# Patient Record
Sex: Male | Born: 1990
Health system: Southern US, Community
[De-identification: ages and names within clinical notes are randomized; demographics above are authoritative.]

## PROBLEM LIST (undated history)

## (undated) DIAGNOSIS — L409 Psoriasis, unspecified: Secondary | ICD-10-CM

## (undated) DIAGNOSIS — J45909 Unspecified asthma, uncomplicated: Secondary | ICD-10-CM

## (undated) DIAGNOSIS — I10 Essential (primary) hypertension: Secondary | ICD-10-CM

## (undated) DIAGNOSIS — J3089 Other allergic rhinitis: Secondary | ICD-10-CM

## (undated) DIAGNOSIS — E782 Mixed hyperlipidemia: Secondary | ICD-10-CM

## (undated) DIAGNOSIS — D721 Eosinophilia, unspecified: Secondary | ICD-10-CM

## (undated) DIAGNOSIS — R002 Palpitations: Secondary | ICD-10-CM

## (undated) DIAGNOSIS — G4733 Obstructive sleep apnea (adult) (pediatric): Secondary | ICD-10-CM

## (undated) HISTORY — DX: Obstructive sleep apnea (adult) (pediatric): G47.33

## (undated) HISTORY — DX: Mixed hyperlipidemia: E78.2

## (undated) HISTORY — DX: Unspecified asthma, uncomplicated: J45.909

## (undated) HISTORY — DX: Eosinophilia, unspecified: D72.10

## (undated) HISTORY — DX: Palpitations: R00.2

## (undated) HISTORY — DX: Essential (primary) hypertension: I10

## (undated) HISTORY — DX: Other allergic rhinitis: J30.89

## (undated) HISTORY — DX: Psoriasis, unspecified: L40.9

## (undated) HISTORY — PX: WISDOM TOOTH EXTRACTION: SHX21

---
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
## 2008-03-11 ENCOUNTER — Ambulatory Visit: Payer: Self-pay | Admitting: Internal Medicine

## 2008-03-11 DIAGNOSIS — J3089 Other allergic rhinitis: Secondary | ICD-10-CM

## 2008-03-11 DIAGNOSIS — J45909 Unspecified asthma, uncomplicated: Secondary | ICD-10-CM

## 2008-11-30 ENCOUNTER — Encounter: Payer: Self-pay | Admitting: Internal Medicine

## 2010-02-13 NOTE — Letter (Signed)
Summary: [facility]  [facility] [place] [place]   Imported By: Maryln Gottron 02/21/2009 15:28:19  _____________________________________________________________________  External Attachment:    Type:   Image     Comment:   External Document  Appended Document: [facility] continuing meds except the decongestant

## 2015-11-02 ENCOUNTER — Ambulatory Visit (INDEPENDENT_AMBULATORY_CARE_PROVIDER_SITE_OTHER): Payer: BLUE CROSS/BLUE SHIELD | Admitting: Family Medicine

## 2015-11-02 ENCOUNTER — Encounter: Payer: Self-pay | Admitting: Family Medicine

## 2015-11-02 VITALS — BP 140/98 | HR 72 | Temp 98.3°F | Ht 68.0 in | Wt 303.5 lb

## 2015-11-02 DIAGNOSIS — R0683 Snoring: Secondary | ICD-10-CM

## 2015-11-02 DIAGNOSIS — Z0000 Encounter for general adult medical examination without abnormal findings: Secondary | ICD-10-CM | POA: Diagnosis not present

## 2015-11-02 DIAGNOSIS — J3089 Other allergic rhinitis: Secondary | ICD-10-CM

## 2015-11-02 DIAGNOSIS — G4733 Obstructive sleep apnea (adult) (pediatric): Secondary | ICD-10-CM | POA: Insufficient documentation

## 2015-11-02 DIAGNOSIS — I10 Essential (primary) hypertension: Secondary | ICD-10-CM | POA: Insufficient documentation

## 2015-11-02 DIAGNOSIS — J4520 Mild intermittent asthma, uncomplicated: Secondary | ICD-10-CM | POA: Diagnosis not present

## 2015-11-02 DIAGNOSIS — Z23 Encounter for immunization: Secondary | ICD-10-CM | POA: Diagnosis not present

## 2015-11-02 DIAGNOSIS — Z9989 Dependence on other enabling machines and devices: Secondary | ICD-10-CM

## 2015-11-02 DIAGNOSIS — R030 Elevated blood-pressure reading, without diagnosis of hypertension: Secondary | ICD-10-CM

## 2015-11-02 DIAGNOSIS — Z6841 Body Mass Index (BMI) 40.0 and over, adult: Secondary | ICD-10-CM

## 2015-11-02 DIAGNOSIS — L409 Psoriasis, unspecified: Secondary | ICD-10-CM | POA: Insufficient documentation

## 2015-11-02 NOTE — Assessment & Plan Note (Signed)
Largely resolved, declines inhaler refill.

## 2015-11-02 NOTE — Assessment & Plan Note (Signed)
Controlled on current regimen.   

## 2015-11-02 NOTE — Addendum Note (Signed)
Addended by: Josph MachoANCE, KIMBERLY A on: 11/02/2015 04:09 PM   Modules accepted: Orders

## 2015-11-02 NOTE — Assessment & Plan Note (Signed)
Preventative protocols reviewed and updated unless pt declined. Discussed healthy diet and lifestyle.  

## 2015-11-02 NOTE — Assessment & Plan Note (Addendum)
Discussed healthy diet and lifestyle changes to affect sustainable weight loss. Pt motivated for weight loss - has joined [employer], bought elliptical.

## 2015-11-02 NOTE — Progress Notes (Signed)
BP (!) 140/98   Pulse 72   Temp 98.3 F (36.8 C) (Oral)   Ht 5\' 8"  (1.727 m)   Wt (!) 303 lb 8 oz (137.7 kg)   BMI 46.15 kg/m    CC: new pt to establish care Subjective:    Patient ID: Micheal [name], male    DOB: 03-Dec-1990, 25 y.o.   MRN: [id number]  HPI: Micheal [name] is a 25 y.o. male presenting on 11/02/2015 for Establish Care (? Sleep apnea; +snoring; recently started weight watchers)   No dx HTN.   Scalp psoriasis - pending derm appt   ?OSA - snoring present. Possible apnea. No PNdyspnea. Endorses restful sleep. Very mild daytime sleepiness.   Obesity - just started weight watchers. Just bought elliptical   Childhood asthma - no recent problems. Perennial allergic rhinitis.   Preventative: No recent CPE  Declines flu shot  Tdap today Seat belt use discussed Sunscreen use discussed. No changing moles on skin Non smoker Alcohol - none   Lives with parents Edu: BS Occ: HR rep at [employer]  Activity: active at church [facility] [employer] [employer]. No regular exercise. Just bought elliptical  Diet: joined weight watchers  Relevant past medical, surgical, family and social history reviewed and updated as indicated. Interim medical history since our last visit reviewed. Allergies and medications reviewed and updated. No current outpatient prescriptions on file prior to visit.   No current facility-administered medications on file prior to visit.     Review of Systems  Constitutional: Negative for activity change, appetite change, chills, fatigue, fever and unexpected weight change.  HENT: Negative for hearing loss.   Eyes: Negative for visual disturbance.  Respiratory: Negative for cough, chest tightness, shortness of breath and wheezing.   Cardiovascular: Negative for chest pain, palpitations and leg swelling.  Gastrointestinal: Negative for abdominal distention, abdominal pain, blood in stool, constipation, diarrhea, nausea and vomiting.  Genitourinary: Negative  for difficulty urinating and hematuria.  Musculoskeletal: Negative for arthralgias, myalgias and neck pain.  Skin: Negative for rash.  Neurological: Negative for dizziness, seizures, syncope and headaches.  Hematological: Negative for adenopathy. Does not bruise/bleed easily.  Psychiatric/Behavioral: Negative for dysphoric mood. The patient is not nervous/anxious.    Per HPI unless specifically indicated in ROS section     Objective:    BP (!) 140/98   Pulse 72   Temp 98.3 F (36.8 C) (Oral)   Ht 5\' 8"  (1.727 m)   Wt (!) 303 lb 8 oz (137.7 kg)   BMI 46.15 kg/m   Wt Readings from Last 3 Encounters:  11/02/15 (!) 303 lb 8 oz (137.7 kg)  03/11/08 (!) 268 lb (121.6 kg) (>99 %, Z > 2.33)*   * Growth percentiles are based on CDC 2-20 Years data.    Physical Exam  Constitutional: He is oriented to person, place, and time. He appears well-developed and well-nourished. No distress.  HENT:  Head: Normocephalic and atraumatic.  Right Ear: Hearing, tympanic membrane, external ear and ear canal normal.  Left Ear: Hearing, tympanic membrane, external ear and ear canal normal.  Nose: Nose normal.  Mouth/Throat: Uvula is midline, oropharynx is clear and moist and mucous membranes are normal. No oropharyngeal exudate, posterior oropharyngeal edema or posterior oropharyngeal erythema.  Eyes: Conjunctivae and EOM are normal. Pupils are equal, round, and reactive to light. No scleral icterus.  Neck: Normal range of motion. Neck supple. No thyromegaly present.  Cardiovascular: Normal rate, regular rhythm, normal heart sounds and intact distal  pulses.   No murmur heard. Pulses:      Radial pulses are 2+ on the right side, and 2+ on the left side.  Pulmonary/Chest: Effort normal and breath sounds normal. No respiratory distress. He has no wheezes. He has no rales.  Abdominal: Soft. Bowel sounds are normal. He exhibits no distension and no mass. There is no tenderness. There is no rebound and no  guarding.  Musculoskeletal: Normal range of motion. He exhibits no edema.  Lymphadenopathy:    He has no cervical adenopathy.  Neurological: He is alert and oriented to person, place, and time.  CN grossly intact, station and gait intact  Skin: Skin is warm and dry. Rash noted.  Erythematous rash on scalp, nasal edges, and extensor surfaces  Psychiatric: He has a normal mood and affect. His behavior is normal. Judgment and thought content normal.  Nursing note and vitals reviewed.  No results found for this or any previous visit.    Assessment & Plan:   Problem List Items Addressed This Visit    Asthma    Largely resolved, declines inhaler refill.      Elevated blood pressure reading    bp elevated today - pt will monitor at home and return sooner if persistently elevated. See pt instructions for plan.      Health maintenance examination - Primary    Preventative protocols reviewed and updated unless pt declined. Discussed healthy diet and lifestyle.       Morbid obesity with BMI of 45.0-49.9, adult (HCC)    Discussed healthy diet and lifestyle changes to affect sustainable weight loss. Pt motivated for weight loss - has joined [employer], bought elliptical.      Perennial allergic rhinitis    Controlled on current regimen.      Psoriasis    Scalp and some inverse. Controlled with tea tree shampoo. Pending derm eval next month.      Snoring    ESS today = 7. Obesity increases risk. Pt requests referral for sleep eval.      Relevant Orders   Ambulatory referral to Pulmonology    Other Visit Diagnoses   None.      Follow up plan: Return in about 1 year (around 11/01/2016) for annual exam, prior fasting for blood work.  Eustaquio Boyden, MD

## 2015-11-02 NOTE — Patient Instructions (Addendum)
Tdap today Return at your convenience for fasting labs Sleepiness scale today.  blodo pressure was a bit elevated today. Goal <140/90. Start monitoring at home a few times a week. If staying consistently above goal, return to see me.  Work on low salt/sodium diet - goal <1.5gm (1,500mg ) per day. Eat a diet high in fruits/vegetables and whole grains.  Look into mediterranean and DASH diet. Goal activity is 15min/wk of moderate intensity exercise.  This can be split into 30 minute chunks.  If you are not at this level, you can start with smaller 10-15 min increments and slowly build up activity. Look at www.heart.org for more resources   Health Maintenance, Male A healthy lifestyle and preventative care can promote health and wellness.  Maintain regular health, dental, and eye exams.  Eat a healthy diet. Foods like vegetables, fruits, whole grains, low-fat dairy products, and lean protein foods contain the nutrients you need and are low in calories. Decrease your intake of foods high in solid fats, added sugars, and salt. Get information about a proper diet from your health care provider, if necessary.  Regular physical exercise is one of the most important things you can do for your health. Most adults should get at least 150 minutes of moderate-intensity exercise (any activity that increases your heart rate and causes you to sweat) each week. In addition, most adults need muscle-strengthening exercises on 2 or more days a week.   Maintain a healthy weight. The body mass index (BMI) is a screening tool to identify possible weight problems. It provides an estimate of body fat based on height and weight. Your health care provider can find your BMI and can help you achieve or maintain a healthy weight. For males 20 years and older:  A BMI below 18.5 is considered underweight.  A BMI of 18.5 to 24.9 is normal.  A BMI of 25 to 29.9 is considered overweight.  A BMI of 30 and above is  considered obese.  Maintain normal blood lipids and cholesterol by exercising and minimizing your intake of saturated fat. Eat a balanced diet with plenty of fruits and vegetables. Blood tests for lipids and cholesterol should begin at age 29 and be repeated every 5 years. If your lipid or cholesterol levels are high, you are over age 101, or you are at high risk for heart disease, you may need your cholesterol levels checked more frequently.Ongoing high lipid and cholesterol levels should be treated with medicines if diet and exercise are not working.  If you smoke, find out from your health care provider how to quit. If you do not use tobacco, do not start.  Lung cancer screening is recommended for adults aged 55-80 years who are at high risk for developing lung cancer because of a history of smoking. A yearly low-dose CT scan of the lungs is recommended for people who have at least a 30-pack-year history of smoking and are current smokers or have quit within the past 15 years. A pack year of smoking is smoking an average of 1 pack of cigarettes a day for 1 year (for example, a 30-pack-year history of smoking could mean smoking 1 pack a day for 30 years or 2 packs a day for 15 years). Yearly screening should continue until the smoker has stopped smoking for at least 15 years. Yearly screening should be stopped for people who develop a health problem that would prevent them from having lung cancer treatment.  If you choose to drink alcohol,  do not have more than 2 drinks per day. One drink is considered to be 12 oz (360 mL) of beer, 5 oz (150 mL) of wine, or 1.5 oz (45 mL) of liquor.  Avoid the use of street drugs. Do not share needles with anyone. Ask for help if you need support or instructions about stopping the use of drugs.  High blood pressure causes heart disease and increases the risk of stroke. High blood pressure is more likely to develop in:  People who have blood pressure in the end of the  normal range (100-139/85-89 mm Hg).  People who are overweight or obese.  People who are African American.  If you are 2818-27 years of age, have your blood pressure checked every 3-5 years. If you are 27 years of age or older, have your blood pressure checked every year. You should have your blood pressure measured twice--once when you are at a hospital or clinic, and once when you are not at a hospital or clinic. Record the average of the two measurements. To check your blood pressure when you are not at a hospital or clinic, you can use:  An automated blood pressure machine at a pharmacy.  A home blood pressure monitor.  If you are 4745-27 years old, ask your health care provider if you should take aspirin to prevent heart disease.  Diabetes screening involves taking a blood sample to check your fasting blood sugar level. This should be done once every 3 years after age 27 if you are at a normal weight and without risk factors for diabetes. Testing should be considered at a younger age or be carried out more frequently if you are overweight and have at least 1 risk factor for diabetes.  Colorectal cancer can be detected and often prevented. Most routine colorectal cancer screening begins at the age of 27 and continues through age 27. However, your health care provider may recommend screening at an earlier age if you have risk factors for colon cancer. On a yearly basis, your health care provider may provide home test kits to check for hidden blood in the stool. A small camera at the end of a tube may be used to directly examine the colon (sigmoidoscopy or colonoscopy) to detect the earliest forms of colorectal cancer. Talk to your health care provider about this at age 27 when routine screening begins. A direct exam of the colon should be repeated every 5-10 years through age 27, unless early forms of precancerous polyps or small growths are found.  People who are at an increased risk for hepatitis  B should be screened for this virus. You are considered at high risk for hepatitis B if:  You were born in a country where hepatitis B occurs often. Talk with your health care provider about which countries are considered high risk.  Your parents were born in a high-risk country and you have not received a shot to protect against hepatitis B (hepatitis B vaccine).  You have HIV or AIDS.  You use needles to inject street drugs.  You live with, or have sex with, someone who has hepatitis B.  You are a man who has sex with other men (MSM).  You get hemodialysis treatment.  You take certain medicines for conditions like cancer, organ transplantation, and autoimmune conditions.  Hepatitis C blood testing is recommended for all people born from [address][birth year] through 1967 and any individual with known risk factors for hepatitis C.  Healthy men should no longer  receive prostate-specific antigen (PSA) blood tests as part of routine cancer screening. Talk to your health care provider about prostate cancer screening.  Testicular cancer screening is not recommended for adolescents or adult males who have no symptoms. Screening includes self-exam, a health care provider exam, and other screening tests. Consult with your health care provider about any symptoms you have or any concerns you have about testicular cancer.  Practice safe sex. Use condoms and avoid high-risk sexual practices to reduce the spread of sexually transmitted infections (STIs).  You should be screened for STIs, including gonorrhea and chlamydia if:  You are sexually active and are younger than 24 years.  You are older than 24 years, and your health care provider tells you that you are at risk for this type of infection.  Your sexual activity has changed since you were last screened, and you are at an increased risk for chlamydia or gonorrhea. Ask your health care provider if you are at risk.  If you are at risk of being infected with  HIV, it is recommended that you take a prescription medicine daily to prevent HIV infection. This is called pre-exposure prophylaxis (PrEP). You are considered at risk if:  You are a man who has sex with other men (MSM).  You are a heterosexual man who is sexually active with multiple partners.  You take drugs by injection.  You are sexually active with a partner who has HIV.  Talk with your health care provider about whether you are at high risk of being infected with HIV. If you choose to begin PrEP, you should first be tested for HIV. You should then be tested every 3 months for as long as you are taking PrEP.  Use sunscreen. Apply sunscreen liberally and repeatedly throughout the day. You should seek shade when your shadow is shorter than you. Protect yourself by wearing long sleeves, pants, a wide-brimmed hat, and sunglasses year round whenever you are outdoors.  Tell your health care provider of new moles or changes in moles, especially if there is a change in shape or color. Also, tell your health care provider if a mole is larger than the size of a pencil eraser.  A one-time screening for abdominal aortic aneurysm (AAA) and surgical repair of large AAAs by ultrasound is recommended for men aged 65-75 years who are current or former smokers.  Stay current with your vaccines (immunizations).   This information is not intended to replace advice given to you by your health care provider. Make sure you discuss any questions you have with your health care provider.   Document Released: 06/29/2007 Document Revised: 01/21/2014 Document Reviewed: 05/28/2010 Elsevier Interactive Patient Education [employer]

## 2015-11-02 NOTE — Assessment & Plan Note (Signed)
ESS today = 7. Obesity increases risk. Pt requests referral for sleep eval.

## 2015-11-02 NOTE — Progress Notes (Signed)
Pre visit review using our clinic review tool, if applicable. No additional management support is needed unless otherwise documented below in the visit note. 

## 2015-11-02 NOTE — Assessment & Plan Note (Signed)
bp elevated today - pt will monitor at home and return sooner if persistently elevated. See pt instructions for plan.

## 2015-11-02 NOTE — Assessment & Plan Note (Signed)
Scalp and some inverse. Controlled with tea tree shampoo. Pending derm eval next month.

## 2015-11-08 ENCOUNTER — Other Ambulatory Visit: Payer: Self-pay | Admitting: Family Medicine

## 2015-11-08 DIAGNOSIS — Z6841 Body Mass Index (BMI) 40.0 and over, adult: Principal | ICD-10-CM

## 2015-11-09 ENCOUNTER — Other Ambulatory Visit (INDEPENDENT_AMBULATORY_CARE_PROVIDER_SITE_OTHER): Payer: BLUE CROSS/BLUE SHIELD

## 2015-11-09 DIAGNOSIS — Z6841 Body Mass Index (BMI) 40.0 and over, adult: Secondary | ICD-10-CM

## 2015-11-10 LAB — LIPID PANEL
CHOL/HDL RATIO: 6
CHOLESTEROL: 197 mg/dL (ref 0–200)
HDL: 33.1 mg/dL — ABNORMAL LOW (ref >39.00)
NONHDL: 163.64
Triglycerides: 240 mg/dL — ABNORMAL HIGH (ref 0.0–149.0)
VLDL: 48 mg/dL — ABNORMAL HIGH (ref 0.0–40.0)

## 2015-11-10 LAB — COMPREHENSIVE METABOLIC PANEL
ALT: 39 U/L (ref 0–53)
AST: 23 U/L (ref 0–37)
Albumin: 4.7 g/dL (ref 3.5–5.2)
Alkaline Phosphatase: 79 U/L (ref 39–117)
BILIRUBIN TOTAL: 0.3 mg/dL (ref 0.2–1.2)
BUN: 17 mg/dL (ref 6–23)
CALCIUM: 9.9 mg/dL (ref 8.4–10.5)
CO2: 28 meq/L (ref 19–32)
CREATININE: 0.9 mg/dL (ref 0.40–1.50)
Chloride: 98 mEq/L (ref 96–112)
GFR: 107.3 mL/min (ref >60.00)
GLUCOSE: 77 mg/dL (ref 70–99)
Potassium: 4.1 mEq/L (ref 3.5–5.1)
SODIUM: 136 meq/L (ref 135–145)
Total Protein: 8.4 g/dL — ABNORMAL HIGH (ref 6.0–8.3)

## 2015-11-10 LAB — LDL CHOLESTEROL, DIRECT: LDL DIRECT: 151 mg/dL

## 2015-11-13 LAB — TSH: TSH: 3.25 u[IU]/mL (ref 0.35–4.50)

## 2015-11-17 ENCOUNTER — Encounter: Payer: Self-pay | Admitting: *Deleted

## 2015-12-25 ENCOUNTER — Institutional Professional Consult (permissible substitution): Payer: BLUE CROSS/BLUE SHIELD | Admitting: Internal Medicine

## 2016-01-22 ENCOUNTER — Ambulatory Visit (INDEPENDENT_AMBULATORY_CARE_PROVIDER_SITE_OTHER): Payer: BLUE CROSS/BLUE SHIELD | Admitting: Internal Medicine

## 2016-01-22 ENCOUNTER — Encounter: Payer: Self-pay | Admitting: Internal Medicine

## 2016-01-22 VITALS — BP 132/84 | HR 104 | Wt 302.0 lb

## 2016-01-22 DIAGNOSIS — R400 Somnolence: Secondary | ICD-10-CM

## 2016-01-22 DIAGNOSIS — G4733 Obstructive sleep apnea (adult) (pediatric): Secondary | ICD-10-CM

## 2016-01-22 NOTE — Patient Instructions (Signed)
Will need to obtain sleep study

## 2016-01-22 NOTE — Progress Notes (Signed)
[facility] [place] [facility] Consultation      Date: 01/22/2016,   MRN# [id number] Micheal [name] [birth year] Code Status:  Code Status History    This patient does not have a recorded code status. Please follow your organizational policy for patients in this situation.     Hosp day:@LENGTHOFSTAYDAYS @ Referring MD: @ATDPROV @     PCP:      Admission                  Current  Micheal [name] is a 26 y.o. old male seen in consultation for problems snoring at the request of Dr. 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 Childhood asthma   . Perennial allergic rhinitis   . Psoriasis      SURGICAL HISTORY   Past Surgical History:  Procedure Laterality Date  . WISDOM TOOTH EXTRACTION       FAMILY HISTORY   Family History  Problem Relation Age of Onset  . Valvular heart disease Father     bicuspid aortic valve s/p replacement  . Cancer Maternal Grandfather     pancreatic  . Diabetes Paternal Grandmother   . Hypertension Paternal Grandmother   . CAD Neg Hx   . Stroke Neg Hx      SOCIAL HISTORY   Social History  Substance Use Topics  . Smoking status: Never Smoker  . Smokeless tobacco: Never Used  . Alcohol use No     MEDICATIONS    Home Medication:  Current Outpatient Rx  . Order #: [id number] Class: Historical Med  . Order #:  [id number] Class: Historical Med    Current Medication:  Current Outpatient Prescriptions:  .  cetirizine (ZYRTEC) 10 MG tablet, Take 10 mg by mouth daily., Disp: , Rfl:  .  fluticasone (FLONASE) 50 MCG/ACT nasal spray, Place 2 sprays into both nostrils daily., Disp: , Rfl:     ALLERGIES   Patient has no known allergies.     REVIEW OF SYSTEMS   Review of Systems  Constitutional: Negative for chills, diaphoresis, fever, malaise/fatigue and weight loss.  HENT: Negative for congestion and hearing loss.   Eyes: Negative for blurred vision and double vision.  Respiratory: Negative for cough, hemoptysis, sputum production, shortness of breath and wheezing.   Cardiovascular: Negative for chest pain, palpitations and orthopnea.  Gastrointestinal: Negative for abdominal pain, heartburn, nausea and vomiting.  Genitourinary: Negative for dysuria and urgency.  Musculoskeletal: Negative for back pain, myalgias and neck pain.  Skin: Negative for rash.  Neurological: Negative for dizziness and weakness.  Endo/Heme/Allergies: Does not bruise/bleed easily.  Psychiatric/Behavioral: Negative for depression and suicidal ideas.  All other systems reviewed and are negative.    BP 132/84 (BP Location: Left Arm, Cuff Size: Normal)   Pulse (!) 104   Wt (!) 302 lb (137 kg)  SpO2 98%   BMI 45.92 kg/m    PHYSICAL EXAM  Physical Exam  Constitutional: He is oriented to person, place, and time. He appears well-developed and well-nourished. No distress.  HENT:  Head: Normocephalic and atraumatic.  Mouth/Throat: No oropharyngeal exudate.  Eyes: EOM are normal. Pupils are equal, round, and reactive to light. No scleral icterus.  Neck: Normal range of motion. Neck supple.  Cardiovascular: Normal rate, regular rhythm and normal heart sounds.   No murmur heard. Pulmonary/Chest: No stridor. No respiratory distress. He has no wheezes.  Abdominal: Soft. Bowel sounds are normal.  Musculoskeletal: Normal  range of motion. He exhibits no edema.  Neurological: He is alert and oriented to person, place, and time. He displays normal reflexes. Coordination normal.  Skin: Skin is warm. He is not diaphoretic.  Psychiatric: He has a normal mood and affect.             IMAGING   No imaging available    ASSESSMENT/PLAN   26 yo pleasant obese white male with signs and symptoms of daytime fatigue and problems breathing while asleep very suggestive of OSA  Will need OSA ASAP Recommend Diet and excercise  Follow up after sleep study completed  I have personally obtained a history, examined the patient, evaluated laboratory and independently reviewed imaging results, formulated the assessment and plan and placed orders.  The Patient requires high complexity decision making for assessment and support, frequent evaluation and titration of therapies, application of advanced monitoring technologies and extensive interpretation of multiple databases.    Patient  satisfied with Plan of action and management. All questions answered  Micheal [name], M.D.  [facility]  Medical Director [facility] Medical Director [facility] Card[facility]-P[facility] Department

## 2016-01-28 ENCOUNTER — Encounter: Payer: Self-pay | Admitting: Internal Medicine

## 2016-01-28 DIAGNOSIS — G4733 Obstructive sleep apnea (adult) (pediatric): Secondary | ICD-10-CM | POA: Diagnosis not present

## 2016-01-28 DIAGNOSIS — R400 Somnolence: Secondary | ICD-10-CM

## 2016-02-07 ENCOUNTER — Telehealth: Payer: Self-pay | Admitting: *Deleted

## 2016-02-07 DIAGNOSIS — G4733 Obstructive sleep apnea (adult) (pediatric): Secondary | ICD-10-CM

## 2016-02-07 NOTE — Telephone Encounter (Signed)
Pt informed of sleep study. Auto CPAP ordered. Nothing further needed.

## 2016-02-20 DIAGNOSIS — G4733 Obstructive sleep apnea (adult) (pediatric): Secondary | ICD-10-CM | POA: Diagnosis not present

## 2016-03-19 DIAGNOSIS — G4733 Obstructive sleep apnea (adult) (pediatric): Secondary | ICD-10-CM | POA: Diagnosis not present

## 2016-04-19 DIAGNOSIS — G4733 Obstructive sleep apnea (adult) (pediatric): Secondary | ICD-10-CM | POA: Diagnosis not present

## 2016-05-19 DIAGNOSIS — G4733 Obstructive sleep apnea (adult) (pediatric): Secondary | ICD-10-CM | POA: Diagnosis not present

## 2016-06-03 ENCOUNTER — Ambulatory Visit (INDEPENDENT_AMBULATORY_CARE_PROVIDER_SITE_OTHER): Payer: BLUE CROSS/BLUE SHIELD | Admitting: Internal Medicine

## 2016-06-03 ENCOUNTER — Encounter: Payer: Self-pay | Admitting: Internal Medicine

## 2016-06-03 VITALS — BP 140/90 | HR 94 | Resp 16 | Ht 68.0 in | Wt 295.0 lb

## 2016-06-03 DIAGNOSIS — G4733 Obstructive sleep apnea (adult) (pediatric): Secondary | ICD-10-CM

## 2016-06-03 NOTE — Progress Notes (Signed)
  [facility]      Date: 06/03/2016,   MRN# [id number] Micheal [name] A Micheal [name] Dec 01, 1990 Code Status:  Code Status History    This patient does not have a recorded code status. Please follow your organizational policy for patients in this situation.     Hosp day:@LENGTHOFSTAYDAYS @ Referring MD: @ATDPROV @     PCP:      AdmissionWeight: 295 lb (133.8 kg)                 CurrentWeight: 295 lb (133.8 kg) Micheal [name] is a 26 y.o. old male seen in consultation for problems snoring at the request of Dr. Brandon MelnickGiuterirez     CHIEF COMPLAINT:   Follow up sleep apnea   HISTORY OF PRESENT ILLNESS   Doing well with autoCPAP therapy More energy less fatigue +weight loss increased exercised tolerance  AHI was 92 on sleep study Compliance report shows 100% with AHI 4.4  Has problems with dry mouth Has increased leak autoCPAP 5-20 set pressures    Current Medication:  Current Outpatient Prescriptions:  .  cetirizine (ZYRTEC) 10 MG tablet, Take 10 mg by mouth daily., Disp: , Rfl:  .  fluticasone (FLONASE) 50 MCG/ACT nasal spray, Place 2 sprays into both nostrils daily., Disp: , Rfl:     ALLERGIES   Patient has no known allergies.     REVIEW OF SYSTEMS   Review of Systems  Constitutional: Negative for chills, diaphoresis, fever, malaise/fatigue and weight loss.  Respiratory: Negative for cough, hemoptysis, sputum production, shortness of breath and wheezing.   Cardiovascular: Negative for chest pain, palpitations and orthopnea.  Neurological: Negative for weakness.  All other systems reviewed and are negative.    BP 140/90 (BP Location: Left Arm, Cuff Size: Large)   Pulse 94   Resp 16   Ht 5\' 8"  (1.727 m)   Wt 295 lb (133.8 kg)   SpO2 98%   BMI 44.85 kg/m    PHYSICAL EXAM  Physical Exam  Constitutional: He appears well-developed and well-nourished. No distress.  Cardiovascular: Normal rate, regular rhythm and normal heart sounds.     No murmur heard. Pulmonary/Chest: Effort normal and breath sounds normal. No stridor. No respiratory distress. He has no wheezes.  Musculoskeletal: Normal range of motion. He exhibits no edema.  Skin: Skin is warm. He is not diaphoretic.  Psychiatric: He has a normal mood and affect.     ASSESSMENT/PLAN   26 yo pleasant obese white male with Severe OSA with AutoCPAP 5-20 now with AHI 4.4  1.continue autoCPAP-change to 5-15 cm h20 2.Recommend Diet and exercise 3.humidify machine  Follow up in 2-3 months for repeat assessment  Patient  satisfied with Plan of action and management. All questions answered  Lucie LeatherKurian David Scarlett Portlock, M.D.  [facility]  Medical Director [facility] Medical Director [facility]

## 2016-06-03 NOTE — Patient Instructions (Signed)
Decreased pressure AutoCPAP 5-15 cmh20 Humidify the machine

## 2017-07-03 ENCOUNTER — Encounter: Payer: Self-pay | Admitting: Internal Medicine

## 2017-12-09 ENCOUNTER — Encounter (HOSPITAL_COMMUNITY): Payer: Self-pay | Admitting: Emergency Medicine

## 2017-12-09 ENCOUNTER — Other Ambulatory Visit: Payer: Self-pay

## 2017-12-09 ENCOUNTER — Emergency Department (HOSPITAL_COMMUNITY): Payer: BLUE CROSS/BLUE SHIELD

## 2017-12-09 ENCOUNTER — Emergency Department (HOSPITAL_COMMUNITY)
Admission: EM | Admit: 2017-12-09 | Discharge: 2017-12-09 | Disposition: A | Discharge status: Home / Self Care | Payer: BLUE CROSS/BLUE SHIELD | Attending: Emergency Medicine | Admitting: Emergency Medicine

## 2017-12-09 DIAGNOSIS — D721 Eosinophilia: Secondary | ICD-10-CM | POA: Diagnosis not present

## 2017-12-09 DIAGNOSIS — Z79899 Other long term (current) drug therapy: Secondary | ICD-10-CM | POA: Diagnosis not present

## 2017-12-09 DIAGNOSIS — J9811 Atelectasis: Secondary | ICD-10-CM | POA: Diagnosis not present

## 2017-12-09 DIAGNOSIS — R072 Precordial pain: Secondary | ICD-10-CM | POA: Diagnosis not present

## 2017-12-09 DIAGNOSIS — R079 Chest pain, unspecified: Secondary | ICD-10-CM | POA: Diagnosis not present

## 2017-12-09 DIAGNOSIS — J45909 Unspecified asthma, uncomplicated: Secondary | ICD-10-CM | POA: Diagnosis not present

## 2017-12-09 LAB — COMPREHENSIVE METABOLIC PANEL
ALT: 56 U/L — ABNORMAL HIGH (ref 0–44)
ANION GAP: 12 (ref 5–15)
AST: 33 U/L (ref 15–41)
Albumin: 5 g/dL (ref 3.5–5.0)
Alkaline Phosphatase: 83 U/L (ref 38–126)
BUN: 12 mg/dL (ref 6–20)
CHLORIDE: 103 mmol/L (ref 98–111)
CO2: 22 mmol/L (ref 22–32)
Calcium: 10 mg/dL (ref 8.9–10.3)
Creatinine, Ser: 0.97 mg/dL (ref 0.61–1.24)
Glucose, Bld: 88 mg/dL (ref 70–99)
POTASSIUM: 3.7 mmol/L (ref 3.5–5.1)
Sodium: 137 mmol/L (ref 135–145)
TOTAL PROTEIN: 8.9 g/dL — AB (ref 6.5–8.1)
Total Bilirubin: 0.7 mg/dL (ref 0.3–1.2)

## 2017-12-09 LAB — I-STAT TROPONIN, ED
TROPONIN I, POC: 0 ng/mL (ref 0.00–0.08)
TROPONIN I, POC: 0 ng/mL (ref 0.00–0.08)

## 2017-12-09 LAB — CBC WITH DIFFERENTIAL/PLATELET
ABS IMMATURE GRANULOCYTES: 0 10*3/uL (ref 0.00–0.07)
BASOS PCT: 2 %
Basophils Absolute: 0.2 10*3/uL — ABNORMAL HIGH (ref 0.0–0.1)
EOS ABS: 2.5 10*3/uL — AB (ref 0.0–0.5)
EOS PCT: 28 %
HCT: 48.7 % (ref 39.0–52.0)
Hemoglobin: 15.6 g/dL (ref 13.0–17.0)
Lymphocytes Relative: 24 %
Lymphs Abs: 2.1 10*3/uL (ref 0.7–4.0)
MCH: 27.4 pg (ref 26.0–34.0)
MCHC: 32 g/dL (ref 30.0–36.0)
MCV: 85.6 fL (ref 80.0–100.0)
MONOS PCT: 4 %
Monocytes Absolute: 0.4 10*3/uL (ref 0.1–1.0)
NEUTROS PCT: 42 %
Neutro Abs: 3.7 10*3/uL (ref 1.7–7.7)
PLATELETS: 244 10*3/uL (ref 150–400)
RBC: 5.69 MIL/uL (ref 4.22–5.81)
RDW: 12.2 % (ref 11.5–15.5)
WBC: 8.8 10*3/uL (ref 4.0–10.5)
nRBC: 0 % (ref 0.0–0.2)

## 2017-12-09 LAB — LIPASE, BLOOD: LIPASE: 27 U/L (ref 11–51)

## 2017-12-09 MED ORDER — ALUM & MAG HYDROXIDE-SIMETH 200-200-20 MG/5ML PO SUSP
30.0000 mL | Freq: Once | ORAL | Status: AC
  Administered 2017-12-09: 30 mL via ORAL
  Filled 2017-12-09: qty 30

## 2017-12-09 MED ORDER — FAMOTIDINE 20 MG PO TABS: 20.0000 mg | ORAL_TABLET | Freq: Two times a day (BID) | ORAL | 0 refills | Status: DC

## 2017-12-09 MED ORDER — LIDOCAINE VISCOUS HCL 2 % MT SOLN
15.0000 mL | Freq: Once | OROMUCOSAL | Status: AC
  Administered 2017-12-09: 15 mL via ORAL
  Filled 2017-12-09: qty 15

## 2017-12-09 MED ORDER — SODIUM CHLORIDE 0.9 % IV BOLUS
1000.0000 mL | Freq: Once | INTRAVENOUS | Status: AC
  Administered 2017-12-09: 1000 mL via INTRAVENOUS

## 2017-12-09 NOTE — Discharge Instructions (Addendum)
You were evaluated today for chest pain. Your labs, imaging, EKG were negative. Please follow up with your PCP for reevaluation. I have prescribed you Pepcid. Please take as prescribed.  Return to the ED with any new or worsening symptoms such as:  Get help right away if: Your chest pain is worse. You have a cough that gets worse, or you cough up blood. You have very bad (severe) pain in your belly (abdomen). You are very weak. You pass out (faint). You have either of these for no clear reason: Sudden chest discomfort. Sudden discomfort in your arms, back, neck, or jaw. You have shortness of breath at any time. You suddenly start to sweat, or your skin gets clammy. You feel sick to your stomach (nauseous). You throw up (vomit). You suddenly feel light-headed or dizzy. Your heart starts to beat fast, or it feels like it is skipping beats.

## 2017-12-09 NOTE — ED Provider Notes (Signed)
[facility] EMERGENCY DEPARTMENT Provider Note   CSN: [id number] Arrival date & time: 12/09/17  4098   History   Chief Complaint Chief Complaint  Patient presents with  . Chest Pain    HPI Micheal [name] is a 27 y.o. male with past medical history significant for OSA on CPAP, obesity who presents for evaluation of chest pressure.  Patient states he has had intermittent centralized chest pressure over the last 2 days.  Patient denies associated lightheaded or dizziness, radiation of pain, diaphoresis, shortness of breath.  Chest pain is nonexertional in nature.  Rates his pain a 4/10 and describes it as a dull aching.  States his sensation only lasted "a couple of minutes and goes away on its own."  Patient states he has been "burping a lot and I have had diarrhea the last 24 hours."  Patient midst to history of reflux, however states he has not had issues with this since he started on CPAP approximately 1 year ago.  Patient states when he had these episodes that he took his blood pressure and had a systolic blood pressure of 200.  Patient states he does have a history of "high blood pressure" however he states he his PCP did not feel at the time that he needed to be prescribed medication for this.  Denies fever, chills, nausea, vomiting, abdominal pain, dysuria, constipation.  Denies aggravating or alleviating factors otherwise stated above.  Denies hypercholesterolemia, diabetes, family history of cardiac disorders in early age, family history of sudden death.  No history of DVT or PE.  Denies history of clotting disorders.  History obtained by patient and family. No interpretor was used.  HPI  Past Medical History:  Diagnosis Date  . Childhood asthma   . Perennial allergic rhinitis   . Psoriasis     Patient Active Problem List   Diagnosis Date Noted  . Health maintenance examination 11/02/2015  . Morbid obesity with BMI of 45.0-49.9, adult (HCC) 11/02/2015  .  Psoriasis 11/02/2015  . Snoring 11/02/2015  . Elevated blood pressure reading 11/02/2015  . Perennial allergic rhinitis 03/11/2008  . Asthma 03/11/2008    Past Surgical History:  Procedure Laterality Date  . WISDOM TOOTH EXTRACTION        Home Medications    Prior to Admission medications   Medication Sig Start Date End Date Taking? Authorizing Provider  cetirizine (ZYRTEC) 10 MG tablet Take 10 mg by mouth daily.    [provider]  famotidine (PEPCID) 20 MG tablet Take 1 tablet (20 mg total) by mouth 2 (two) times daily. 12/09/17   Henderly, Britni A, PA-C  fluticasone (FLONASE) 50 MCG/ACT nasal spray Place 2 sprays into both nostrils daily.    [provider]    Family History Family History  Problem Relation Age of Onset  . Valvular heart disease Father        bicuspid aortic valve s/p replacement  . Cancer Maternal Grandfather        pancreatic  . Diabetes Paternal Grandmother   . Hypertension Paternal Grandmother   . CAD Neg Hx   . Stroke Neg Hx     Social History Social History   Tobacco Use  . Smoking status: Never Smoker  . Smokeless tobacco: Never Used  Substance Use Topics  . Alcohol use: No  . Drug use: No     Allergies   Patient has no known allergies.   Review of Systems Review of Systems  Constitutional: Negative.  HENT: Positive for congestion, rhinorrhea and sinus pressure. Negative for dental problem, drooling, ear discharge, ear pain, facial swelling, hearing loss, mouth sores, nosebleeds, postnasal drip, sinus pain, sneezing, sore throat, tinnitus, trouble swallowing and voice change.   Respiratory: Negative for cough, choking, chest tightness, shortness of breath, wheezing and stridor.   Cardiovascular: Negative for chest pain, palpitations and leg swelling.  Gastrointestinal: Negative for abdominal distention, abdominal pain, anal bleeding, blood in stool, constipation, diarrhea, nausea, rectal pain and vomiting.        Reflux symptoms.  Genitourinary: Negative.   Musculoskeletal: Negative.   Skin: Negative.   Neurological: Negative.   All other systems reviewed and are negative.    Physical Exam Updated Vital Signs BP 135/74 (BP Location: Right Arm)   Pulse 84   Temp 98.3 F (36.8 C) (Oral)   Resp (!) 21   Ht 5' 8.5" (1.74 m)   Wt 135.2 kg   SpO2 98%   BMI 44.65 kg/m   Physical Exam  Constitutional: Vital signs are normal. He appears well-developed and well-nourished.  Non-toxic appearance. He does not have a sickly appearance. He does not appear ill. No distress.  HENT:  Head: Normocephalic and atraumatic.  Nose: Nose normal. No mucosal edema, rhinorrhea, sinus tenderness or nasal deformity. Right sinus exhibits no maxillary sinus tenderness and no frontal sinus tenderness. Left sinus exhibits no maxillary sinus tenderness and no frontal sinus tenderness.  Mouth/Throat: Uvula is midline, oropharynx is clear and moist and mucous membranes are normal. No trismus in the jaw. No uvula swelling. No oropharyngeal exudate, posterior oropharyngeal edema, posterior oropharyngeal erythema or tonsillar abscesses. No tonsillar exudate.  Eyes: Pupils are equal, round, and reactive to light.  Neck: Normal range of motion, full passive range of motion without pain and phonation normal. Neck supple. No JVD present. Carotid bruit is not present. No neck rigidity. No edema and no erythema present.  Cardiovascular: Normal rate, regular rhythm, normal heart sounds, intact distal pulses and normal pulses.  Pulmonary/Chest: Effort normal and breath sounds normal. No accessory muscle usage or stridor. No tachypnea. No respiratory distress. He has no decreased breath sounds. He has no wheezes. He has no rhonchi. He has no rales.  Abdominal: Soft. Normal appearance and bowel sounds are normal. He exhibits no distension, no pulsatile liver, no ascites, no pulsatile midline mass and no mass. There is no splenomegaly or  hepatomegaly. There is no tenderness. There is no rigidity, no rebound, no guarding, no CVA tenderness, no tenderness at McBurney's point and negative Murphy's sign.  Musculoskeletal: Normal range of motion.  Neurological: He is alert. He has normal strength. No sensory deficit.  Moves all extremities without ataxia  Skin: Skin is warm and dry. He is not diaphoretic.  No edema, erythema or warmth.  Psychiatric: He has a normal mood and affect.  Nursing note and vitals reviewed.    ED Treatments / Results  Labs (all labs ordered are listed, but only abnormal results are displayed) Labs Reviewed  CBC WITH DIFFERENTIAL/PLATELET - Abnormal; Notable for the following components:      Result Value   Eosinophils Absolute 2.5 (*)    Basophils Absolute 0.2 (*)    All other components within normal limits  COMPREHENSIVE METABOLIC PANEL - Abnormal; Notable for the following components:   Total Protein 8.9 (*)    ALT 56 (*)    All other components within normal limits  LIPASE, BLOOD  PATHOLOGIST SMEAR REVIEW  I-STAT TROPONIN, ED  I-STAT TROPONIN,  ED    EKG EKG Interpretation  Date/Time:  Tuesday December 09 2017 09:10:00 EST Ventricular Rate:  110 PR Interval:    QRS Duration: 99 QT Interval:  305 QTC Calculation: 413 R Axis:   73 Text Interpretation:  Sinus tachycardia Baseline wander in lead(s) I V6 normal. no old comparison. Confirmed by Arby Barrette [phone]) on 12/09/2017 12:08:17 PM   Radiology Dg Chest 2 View  Result Date: 12/09/2017 CLINICAL DATA:  Palpitation.  Lightheadedness. EXAM: CHEST - 2 VIEW COMPARISON:  No prior. FINDINGS: Mediastinum and hilar structures normal. Low lung volumes with mild bibasilar atelectasis. No pleural effusion or pneumothorax. Heart size normal. No acute bony abnormality IMPRESSION: Low lung volumes with mild bibasilar atelectasis. Electronically Signed   By: Maisie Fus  Register   On: 12/09/2017 09:52    Procedures Procedures (including  critical care time)  Medications Ordered in ED Medications  sodium chloride 0.9 % bolus 1,000 mL (1,000 mLs Intravenous New Bag/Given 12/09/17 0928)  alum & mag hydroxide-simeth (MAALOX/MYLANTA) 200-200-20 MG/5ML suspension 30 mL (30 mLs Oral Given 12/09/17 0928)    And  lidocaine (XYLOCAINE) 2 % viscous mouth solution 15 mL (15 mLs Oral Given 12/09/17 6045)     Initial Impression / Assessment and Plan / ED Course  I have reviewed the triage vital signs and the nursing notes.  Pertinent labs & imaging results that were available during my care of the patient were reviewed by me and considered in my medical decision making (see chart for details).  27 year old who appears otherwise well presents for evaluation of chest pain. Intermittent in nature. Describes as a pressure sensation. Pain presents over the last 36 hours. Not related to exertion. No associated dyspnea, diaphoresis, radiation of pain, nausea, emesis. Hx of elevated blood pressure however no diagnosis of hypertension, not medicated. Systolic was 200 yesterday evening. Admits to reflux and increased burping and diarrhea 2x in 24 hours. No melena or BRBPR.  Mild hypertension in department at 160/89 which resolved with BP rechecks. No current chest pain. Mild tachycardia. States he feels anxious. Will get labs, imaging, EKG and give fluids, GI cocktail and reevaluate. No recent hx surgery, unilateral leg swelling, no syncope.  Heart score low risk, Wells criteria low risk, EKG sinus tachycardia without ischemic changes, DeltaTroponin negative, Labs without leukocytosis, metabolic panel without electrolyte abnormalities, normal renal and kidney function, Chest xray without evidence of infiltrates, CHF, pulmonary edema, pneumothorax.  On re-evalauation patient states he feels better with fluids and GI cocktail.  He has no active chest pain or shortness of breath.  Patient is hemodynamically stable and appropriate for DC home at this  time.  Patient is to be discharged with recommendation to follow up with PCP in regards to today's hospital visit. Chest pain is not likely of cardiac or pulmonary etiology d/t presentation, Wells negative, VSS, no tracheal deviation, no JVD or new murmur, RRR, breath sounds equal bilaterally, EKG without acute abnormalities, negative troponin, and negative CXR. Pt has been advised to return to the ED if CP becomes exertional, associated with diaphoresis or nausea, radiates to left jaw/arm, worsens or becomes concerning in any way. Pt appears reliable for follow up and is agreeable to discharge.     Final Clinical Impressions(s) / ED Diagnoses   Final diagnoses:  Precordial pain    ED Discharge Orders         Ordered    famotidine (PEPCID) 20 MG tablet  2 times daily     12/09/17  1209           Henderly, Britni A, PA-C 12/09/17 1249    Arby BarrettePfeiffer, Marcy, MD 12/11/17 1320

## 2017-12-09 NOTE — ED Triage Notes (Signed)
Pt arrives complaining of chest pressure, hypertension (190/110 yesterday) and burping. Pt reports nausea, no SHOB, vomiting, diaphoresis.

## 2017-12-09 NOTE — ED Notes (Signed)
Patient transported to X-ray 

## 2017-12-09 NOTE — ED Notes (Signed)
Pt is difficult stick, unsuccessful IV attempt/ blood draw by this RN.

## 2017-12-09 NOTE — ED Notes (Signed)
Patient left at this time with all belongings. 

## 2017-12-10 LAB — PATHOLOGIST SMEAR REVIEW

## 2017-12-14 NOTE — Progress Notes (Signed)
BP 134/82 (BP Location: Left Arm, Patient Position: Sitting, Cuff Size: Large)   Pulse 91   Temp 98.6 F (37 C) (Oral)   Ht 5\' 8"  (1.727 m)   Wt 299 lb (135.6 kg)   SpO2 98%   BMI 45.46 kg/m    CC: ER f/u visit Subjective:    Patient ID: Zachery Dauer, male    DOB: 05/14/1990, 27 y.o.   MRN: [id number]  HPI: ARTHUR SPEAGLE is a 27 y.o. male presenting on 12/15/2017 for Hospitalization Follow-up (Seen at [facility] ED on 12/09/17.)   Recent eval at ER for chest pressure, note reviewed. CXR ok, TnI neg x2 as was other labowrk. Pt endorsed BP readings up to 180/100 while stressed. Throughout period of chest discomfort he did have significant belching. He did improve some with tums prior to ER eval. Treated with benefit with IVF and GI cocktail (maalox and lidocaine). Chest pain thought non cardiac in nature. ?GI vs anxiety related.   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   He was started on pepcid 20mg  BID with significant benefit. Asks about ongoing dosing.  He has noticed increased indigestion over last several weeks prior to ER visit.  Actually CPAP for OSA started 2017 significantly helped GERD symptoms.   Relevant past medical, surgical, family and social history reviewed and updated as indicated. Interim medical history since our last visit reviewed. Allergies and medications reviewed and updated. Outpatient Medications Prior to Visit  Medication Sig Dispense Refill  . famotidine (PEPCID) 20 MG tablet Take 1 tablet (20 mg total) by mouth 2 (two) times daily. 30 tablet 0  . levocetirizine (XYZAL) 5 MG tablet Take 5 mg by mouth every evening.    . cetirizine (ZYRTEC) 10 MG tablet Take 10 mg by mouth daily.    . fluticasone (FLONASE) 50 MCG/ACT nasal spray Place 2 sprays into both nostrils daily.     No facility-administered medications prior to visit.      Per HPI unless  specifically indicated in ROS section below Review of Systems     Objective:    BP 134/82 (BP Location: Left Arm, Patient Position: Sitting, Cuff Size: Large)   Pulse 91   Temp 98.6 F (37 C) (Oral)   Ht 5\' 8"  (1.727 m)   Wt 299 lb (135.6 kg)   SpO2 98%   BMI 45.46 kg/m   Wt Readings from Last 3 Encounters:  12/15/17 299 lb (135.6 kg)  12/09/17 298 lb (135.2 kg)  06/03/16 295 lb (133.8 kg)    Physical Exam  Constitutional: He appears well-developed and well-nourished. No distress.  HENT:  Mouth/Throat: Oropharynx is clear and moist. No oropharyngeal exudate.  Eyes: Pupils are equal, round, and reactive to light. Conjunctivae and EOM are normal.  Neck: Normal range of motion. Neck supple.  Cardiovascular: Normal rate, regular rhythm and normal heart sounds.  No murmur heard. Pulmonary/Chest: Effort normal and breath sounds normal. No respiratory distress. He has no wheezes. He has no rales.  Abdominal: Soft. Bowel sounds are normal. He exhibits no distension and no mass. There is no tenderness. There is no rebound, no guarding, no CVA tenderness and negative Murphy's sign. No hernia.  Musculoskeletal: Normal range of motion. He exhibits no edema.  Skin: Skin is warm and dry. No rash noted.  Psychiatric: He has a normal mood and affect.  Nursing note and vitals reviewed.  Results for orders placed or performed during the hospital encounter of 12/09/17  CBC with Differential  Result Value Ref Range   WBC 8.8 4.0 - 10.5 K/uL   RBC 5.69 4.22 - 5.81 MIL/uL   Hemoglobin 15.6 13.0 - 17.0 g/dL   HCT 98.148.7 19.139.0 - 47.852.0 %   MCV 85.6 80.0 - 100.0 fL   MCH 27.4 26.0 - 34.0 pg   MCHC 32.0 30.0 - 36.0 g/dL   RDW 29.512.2 62.111.5 - 30.815.5 %   Platelets 244 150 - 400 K/uL   nRBC 0.0 0.0 - 0.2 %   Neutrophils Relative % 42 %   Neutro Abs 3.7 1.7 - 7.7 K/uL   Lymphocytes Relative 24 %   Lymphs Abs 2.1 0.7 - 4.0 K/uL   Monocytes Relative 4 %   Monocytes Absolute 0.4 0.1 - 1.0 K/uL    Eosinophils Relative 28 %   Eosinophils Absolute 2.5 (H) 0.0 - 0.5 K/uL   Basophils Relative 2 %   Basophils Absolute 0.2 (H) 0.0 - 0.1 K/uL   Abs Immature Granulocytes 0.00 0.00 - 0.07 K/uL   Tear Drop Cells PRESENT   Comprehensive metabolic panel  Result Value Ref Range   Sodium 137 135 - 145 mmol/L   Potassium 3.7 3.5 - 5.1 mmol/L   Chloride 103 98 - 111 mmol/L   CO2 22 22 - 32 mmol/L   Glucose, Bld 88 70 - 99 mg/dL   BUN 12 6 - 20 mg/dL   Creatinine, Ser 6.570.97 0.61 - 1.24 mg/dL   Calcium 84.610.0 8.9 - 96.210.3 mg/dL   Total Protein 8.9 (H) 6.5 - 8.1 g/dL   Albumin 5.0 3.5 - 5.0 g/dL   AST 33 15 - 41 U/L   ALT 56 (H) 0 - 44 U/L   Alkaline Phosphatase 83 38 - 126 U/L   Total Bilirubin 0.7 0.3 - 1.2 mg/dL   GFR calc non Af Amer >60 >60 mL/min   GFR calc Af Amer >60 >60 mL/min   Anion gap 12 5 - 15  Lipase, blood  Result Value Ref Range   Lipase 27 11 - 51 U/L  Pathologist smear review  Result Value Ref Range   Path Review Eosinophilia and reactive appearing lymphyocytes   I-Stat Troponin, ED (not at [facility])  Result Value Ref Range   Troponin i, poc 0.00 0.00 - 0.08 ng/mL   Comment 3          I-Stat Troponin, ED ([facility])  Result Value Ref Range   Troponin i, poc 0.00 0.00 - 0.08 ng/mL   Comment 3              Assessment & Plan:   Problem List Items Addressed This Visit    OSA on CPAP    Followed by pulm Dr Belia HemanKasa. Advised he call and schedule f/u appt.       Morbid obesity with BMI of 45.0-49.9, adult (HCC)   GERD (gastroesophageal reflux disease)    Anticipate cause of chest discomfort - continue famotidine at this time. Reviewed diet changes to help control symptoms.      Eosinophilia    Noted at ER - will need this repeated next labwork.       Elevated blood pressure reading in office without diagnosis of hypertension    Isolated elevated readings during periods of high stress and discomfort. He will start monitoring BPs at home and bring in readings in 2-3  wks to  review. Discussed diet and lifestyle changes to maintain good BP control, DASH diet handout provided today.       Chest discomfort - Primary    Anticipate GI related - improved with tums and GI cocktail and now controlled on famotidine 20mg  BID. Advised continue BID dosing for 2 wks then try tapering donw to famotidine 20mg  QHS. Reassess at CPE          No orders of the defined types were placed in this encounter.  No orders of the defined types were placed in this encounter.   Follow up plan: Return in about 3 months (around 03/16/2018) for annual exam, prior fasting for blood work.  Eustaquio Boyden, MD

## 2017-12-15 ENCOUNTER — Ambulatory Visit (INDEPENDENT_AMBULATORY_CARE_PROVIDER_SITE_OTHER): Payer: BLUE CROSS/BLUE SHIELD | Admitting: Family Medicine

## 2017-12-15 ENCOUNTER — Encounter: Payer: Self-pay | Admitting: Family Medicine

## 2017-12-15 VITALS — BP 134/82 | HR 91 | Temp 98.6°F | Ht 68.0 in | Wt 299.0 lb

## 2017-12-15 DIAGNOSIS — R0789 Other chest pain: Secondary | ICD-10-CM | POA: Insufficient documentation

## 2017-12-15 DIAGNOSIS — K219 Gastro-esophageal reflux disease without esophagitis: Secondary | ICD-10-CM

## 2017-12-15 DIAGNOSIS — G4733 Obstructive sleep apnea (adult) (pediatric): Secondary | ICD-10-CM | POA: Diagnosis not present

## 2017-12-15 DIAGNOSIS — Z6841 Body Mass Index (BMI) 40.0 and over, adult: Secondary | ICD-10-CM

## 2017-12-15 DIAGNOSIS — D721 Eosinophilia, unspecified: Secondary | ICD-10-CM

## 2017-12-15 DIAGNOSIS — R030 Elevated blood-pressure reading, without diagnosis of hypertension: Secondary | ICD-10-CM | POA: Diagnosis not present

## 2017-12-15 DIAGNOSIS — Z9989 Dependence on other enabling machines and devices: Secondary | ICD-10-CM

## 2017-12-15 NOTE — Assessment & Plan Note (Signed)
Anticipate GI related - improved with tums and GI cocktail and now controlled on famotidine 20mg  BID. Advised continue BID dosing for 2 wks then try tapering donw to famotidine 20mg  QHS. Reassess at CPE

## 2017-12-15 NOTE — Assessment & Plan Note (Signed)
Followed by pulm Dr Belia HemanKasa. Advised he call and schedule f/u appt.

## 2017-12-15 NOTE — Assessment & Plan Note (Signed)
Anticipate cause of chest discomfort - continue famotidine at this time. Reviewed diet changes to help control symptoms.

## 2017-12-15 NOTE — Assessment & Plan Note (Signed)
Isolated elevated readings during periods of high stress and discomfort. He will start monitoring BPs at home and bring in readings in 2-3 wks to review. Discussed diet and lifestyle changes to maintain good BP control, DASH diet handout provided today.

## 2017-12-15 NOTE — Patient Instructions (Addendum)
Work on [employer] diet and lifestyle changes to maintain good blood pressure control. Work on potassium rich foods (fruits/vegetables, whole grains). Avoid salt/sodium in the diet.  Get BP cuff to start monitoring blood pressures at home. Goal BP <140/90. Continue pepcid 20mg  twice daily for 2 weeks then back down to nightly. If worsening heartburn symptoms, may go back up to twice daily.   GERD precautions: Head of bed elevated. Avoidance of citrus, fatty foods, chocolate, peppermint, and excessive alcohol, along with sodas, orange juice (acidic drinks) At least a few hours between dinner and bed, minimize naps after eating.   Return in 3 months for physical, prior for fasting labs  DASH Eating Plan DASH stands for "Dietary Approaches to Stop Hypertension." The DASH eating plan is a healthy eating plan that has been shown to reduce high blood pressure (hypertension). It may also reduce your risk for type 2 diabetes, heart disease, and stroke. The DASH eating plan may also help with weight loss. What are tips for following this plan? General guidelines  Avoid eating more than 2,300 mg (milligrams) of salt (sodium) a day. If you have hypertension, you may need to reduce your sodium intake to 1,500 mg a day.  Limit alcohol intake to no more than 1 drink a day for nonpregnant women and 2 drinks a day for men. One drink equals 12 oz of beer, 5 oz of wine, or 1 oz of hard liquor.  Work with your health care provider to maintain a healthy body weight or to lose weight. Ask what an ideal weight is for you.  Get at least 30 minutes of exercise that causes your heart to beat faster (aerobic exercise) most days of the week. Activities may include walking, swimming, or biking.  Work with your health care provider or diet and nutrition specialist (dietitian) to adjust your eating plan to your individual calorie needs. Reading food labels  Check food labels for the amount of sodium per serving. Choose  foods with less than 5 percent of the Daily Value of sodium. Generally, foods with less than 300 mg of sodium per serving fit into this eating plan.  To find whole grains, look for the word "whole" as the first word in the ingredient list. Shopping  Buy products labeled as "low-sodium" or "no salt added."  Buy fresh foods. Avoid canned foods and premade or frozen meals. Cooking  Avoid adding salt when cooking. Use salt-free seasonings or herbs instead of table salt or sea salt. Check with your health care provider or pharmacist before using salt substitutes.  Do not fry foods. Cook foods using healthy methods such as baking, boiling, grilling, and broiling instead.  Cook with heart-healthy oils, such as olive, canola, soybean, or sunflower oil. Meal planning   Eat a balanced diet that includes: ? 5 or more servings of fruits and vegetables each day. At each meal, try to fill half of your plate with fruits and vegetables. ? Up to 6-8 servings of whole grains each day. ? Less than 6 oz of lean meat, poultry, or fish each day. A 3-oz serving of meat is about the same size as a deck of cards. One egg equals 1 oz. ? 2 servings of low-fat dairy each day. ? A serving of nuts, seeds, or beans 5 times each week. ? Heart-healthy fats. Healthy fats called Omega-3 fatty acids are found in foods such as flaxseeds and coldwater fish, like sardines, salmon, and mackerel.  Limit how much you eat of  the following: ? Canned or prepackaged foods. ? Food that is high in trans fat, such as fried foods. ? Food that is high in saturated fat, such as fatty meat. ? Sweets, desserts, sugary drinks, and other foods with added sugar. ? Full-fat dairy products.  Do not salt foods before eating.  Try to eat at least 2 vegetarian meals each week.  Eat more home-cooked food and less restaurant, buffet, and fast food.  When eating at a restaurant, ask that your food be prepared with less salt or no salt, if  possible. What foods are recommended? The items listed may not be a complete list. Talk with your dietitian about what dietary choices are best for you. Grains Whole-grain or whole-wheat bread. Whole-grain or whole-wheat pasta. Brown rice. [place]. Bulgur. Whole-grain and low-sodium cereals. Pita bread. Low-fat, low-sodium crackers. Whole-wheat flour tortillas. Vegetables Fresh or frozen vegetables (raw, steamed, roasted, or grilled). Low-sodium or reduced-sodium tomato and vegetable juice. Low-sodium or reduced-sodium tomato sauce and tomato paste. Low-sodium or reduced-sodium canned vegetables. Fruits All fresh, dried, or frozen fruit. Canned fruit in natural juice (without added sugar). Meat and other protein foods Skinless chicken or [place]. Ground chicken or [place]. Pork with fat trimmed off. Fish and seafood. Egg whites. Dried beans, peas, or lentils. Unsalted nuts, nut butters, and seeds. Unsalted canned beans. Lean cuts of beef with fat trimmed off. Low-sodium, lean deli meat. Dairy Low-fat (1%) or fat-free (skim) milk. Fat-free, low-fat, or reduced-fat cheeses. Nonfat, low-sodium ricotta or cottage cheese. Low-fat or nonfat yogurt. Low-fat, low-sodium cheese. Fats and oils Soft margarine without trans fats. Vegetable oil. Low-fat, reduced-fat, or light mayonnaise and salad dressings (reduced-sodium). Canola, safflower, olive, soybean, and sunflower oils. Avocado. Seasoning and other foods Herbs. Spices. Seasoning mixes without salt. Unsalted popcorn and pretzels. Fat-free sweets. What foods are not recommended? The items listed may not be a complete list. Talk with your dietitian about what dietary choices are best for you. Grains Baked goods made with fat, such as croissants, muffins, or some breads. Dry pasta or rice meal packs. Vegetables Creamed or fried vegetables. Vegetables in a cheese sauce. Regular canned vegetables (not low-sodium or reduced-sodium). Regular canned  tomato sauce and paste (not low-sodium or reduced-sodium). Regular tomato and vegetable juice (not low-sodium or reduced-sodium). Angie Fava. Olives. Fruits Canned fruit in a light or heavy syrup. Fried fruit. Fruit in cream or butter sauce. Meat and other protein foods Fatty cuts of meat. Ribs. Fried meat. Berniece Salines. Sausage. Bologna and other processed lunch meats. Salami. Fatback. Hotdogs. Bratwurst. Salted nuts and seeds. Canned beans with added salt. Canned or smoked fish. Whole eggs or egg yolks. Chicken or [place] with skin. Dairy Whole or 2% milk, cream, and half-and-half. Whole or full-fat cream cheese. Whole-fat or sweetened yogurt. Full-fat cheese. Nondairy creamers. Whipped toppings. Processed cheese and cheese spreads. Fats and oils Butter. Stick margarine. Lard. Shortening. Ghee. Bacon fat. Tropical oils, such as coconut, palm kernel, or palm oil. Seasoning and other foods Salted popcorn and pretzels. Onion salt, garlic salt, seasoned salt, table salt, and sea salt. Worcestershire sauce. Tartar sauce. Barbecue sauce. Teriyaki sauce. Soy sauce, including reduced-sodium. Steak sauce. Canned and packaged gravies. Fish sauce. Oyster sauce. Cocktail sauce. Horseradish that you find on the shelf. Ketchup. Mustard. Meat flavorings and tenderizers. Bouillon cubes. Hot sauce and Tabasco sauce. Premade or packaged marinades. Premade or packaged taco seasonings. Relishes. Regular salad dressings. Where to find more information:  National Heart, Lung, and [employer]: https://wilson-eaton.com/  American Heart Association:  www.heart.org Summary  The DASH eating plan is a healthy eating plan that has been shown to reduce high blood pressure (hypertension). It may also reduce your risk for type 2 diabetes, heart disease, and stroke.  With the DASH eating plan, you should limit salt (sodium) intake to 2,300 mg a day. If you have hypertension, you may need to reduce your sodium intake to 1,500 mg a day.  When  on the DASH eating plan, aim to eat more fresh fruits and vegetables, whole grains, lean proteins, low-fat dairy, and heart-healthy fats.  Work with your health care provider or diet and nutrition specialist (dietitian) to adjust your eating plan to your individual calorie needs. This information is not intended to replace advice given to you by your health care provider. Make sure you discuss any questions you have with your health care provider. Document Released: 12/20/2010 Document Revised: 12/25/2015 Document Reviewed: 12/25/2015 Elsevier Interactive Patient Education  [employer].

## 2017-12-15 NOTE — Assessment & Plan Note (Signed)
Noted at ER - will need this repeated next labwork.

## 2018-09-28 DIAGNOSIS — L400 Psoriasis vulgaris: Secondary | ICD-10-CM | POA: Diagnosis not present

## 2019-03-11 ENCOUNTER — Encounter: Payer: Self-pay | Admitting: Family Medicine

## 2019-03-12 ENCOUNTER — Telehealth: Payer: Self-pay

## 2019-03-12 ENCOUNTER — Ambulatory Visit (INDEPENDENT_AMBULATORY_CARE_PROVIDER_SITE_OTHER): Payer: 59 | Admitting: Family Medicine

## 2019-03-12 ENCOUNTER — Other Ambulatory Visit: Payer: Self-pay

## 2019-03-12 ENCOUNTER — Encounter: Payer: Self-pay | Admitting: Family Medicine

## 2019-03-12 VITALS — BP 154/84 | HR 94 | Temp 97.9°F | Ht 68.0 in | Wt 289.4 lb

## 2019-03-12 DIAGNOSIS — G4733 Obstructive sleep apnea (adult) (pediatric): Secondary | ICD-10-CM

## 2019-03-12 DIAGNOSIS — Z6841 Body Mass Index (BMI) 40.0 and over, adult: Secondary | ICD-10-CM

## 2019-03-12 DIAGNOSIS — I10 Essential (primary) hypertension: Secondary | ICD-10-CM

## 2019-03-12 DIAGNOSIS — R002 Palpitations: Secondary | ICD-10-CM

## 2019-03-12 DIAGNOSIS — Z9989 Dependence on other enabling machines and devices: Secondary | ICD-10-CM

## 2019-03-12 LAB — POCT GLUCOSE (DEVICE FOR HOME USE): POC Glucose: 99 mg/dl (ref 70–99)

## 2019-03-12 MED ORDER — METOPROLOL TARTRATE 25 MG PO TABS: 25.0000 mg | ORAL_TABLET | Freq: Two times a day (BID) | ORAL | 3 refills | Status: DC

## 2019-03-12 NOTE — Assessment & Plan Note (Signed)
Encouraged healthy diet and lifestyle changes to affect sustainable weight loss. Reviewed AHA aerobic exercise recommendations.

## 2019-03-12 NOTE — Progress Notes (Signed)
This visit was conducted in person.  BP (!) 154/84 (BP Location: Right Arm, Cuff Size: Large)   Pulse 94   Temp 97.9 F (36.6 C) (Temporal)   Ht 5\' 8"  (1.727 m)   Wt 289 lb 7 oz (131.3 kg)   SpO2 98%   BMI 44.01 kg/m   BP Readings from Last 3 Encounters:  03/12/19 (!) 154/84  12/15/17 134/82  12/09/17 134/73    CC: BP check Subjective:    Patient ID: [id number], male    DOB: Jan 21, 1990, 28 y.o.   MRN: 1990  HPI: Micheal [name] is a 29 y.o. male presenting on 03/12/2019 for Elevated Blood Pressure (C/o elevated BP- 182/117,  elevated HR- 122 and tingling in fingers.  Sxs occurred early this morning.  Denies any chest pain/discomfort. )   Not feeling well over the last few weeks - uneasy feeling.  Woke up this morning around 4:30am - with racing heart. HR 120, BP 182/117. After resting for a while, HR 80s, BP 159/90s. Short winded when heart was racing. Did feel hand paresthesias and mild nausea when he woke up.   He started using eliptical 2 wks ago - 15 min at a time.   Denies fevers/chills, chest pain, tightness, cough, body aches, abd pain, diarrhea.  No polyuria, polydipsia, polyphagia.  He does drink 8-10 cups water/day.   He has cut out caffeine.  No alcohol.  No recreational drugs.   No recent prolonged car or plane ride. No leg swelling or pain. No new supplements or OTC medications.   Family History  Problem Relation Age of Onset  . Valvular heart disease Father        bicuspid aortic valve s/p replacement  . Cancer Maternal Grandfather        pancreatic  . Diabetes Paternal Grandmother   . Hypertension Paternal Grandmother   . CAD Neg Hx   . Stroke Neg Hx         Relevant past medical, surgical, family and social history reviewed and updated as indicated. Interim medical history since our last visit reviewed. Allergies and medications reviewed and updated. Outpatient Medications Prior to Visit  Medication Sig Dispense Refill  . augmented  betamethasone dipropionate (DIPROLENE-AF) 0.05 % cream APPLY TO AFFECTED AREA ON ELBOWS TWICE A DAY UNTIL CLEAR, THEN USE AS NEEDED    . calcipotriene (DOVONOX) 0.005 % cream APPLY ON THE SKIN AS DIRECTED TWICE DAILY TO FACE AND FOLD OF SKIN UNTIL CLEAR THEN AS NEEDED    . famotidine (PEPCID) 20 MG tablet Take 1 tablet (20 mg total) by mouth 2 (two) times daily. 30 tablet 0  . ketoconazole (NIZORAL) 2 % shampoo APPLY TO AFFECTED AREA EVERY DAY    . levocetirizine (XYZAL) 5 MG tablet Take 5 mg by mouth every evening.     No facility-administered medications prior to visit.     Per HPI unless specifically indicated in ROS section below Review of Systems Objective:    BP (!) 154/84 (BP Location: Right Arm, Cuff Size: Large)   Pulse 94   Temp 97.9 F (36.6 C) (Temporal)   Ht 5\' 8"  (1.727 m)   Wt 289 lb 7 oz (131.3 kg)   SpO2 98%   BMI 44.01 kg/m   Wt Readings from Last 3 Encounters:  03/12/19 289 lb 7 oz (131.3 kg)  12/15/17 299 lb (135.6 kg)  12/09/17 298 lb (135.2 kg)    Physical Exam Vitals and nursing note reviewed.  Constitutional:      Appearance: Normal appearance. He is obese. He is not ill-appearing.  HENT:     Head: Normocephalic and atraumatic.  Eyes:     Extraocular Movements: Extraocular movements intact.     Pupils: Pupils are equal, round, and reactive to light.  Neck:     Thyroid: No thyromegaly or thyroid tenderness.  Cardiovascular:     Rate and Rhythm: Regular rhythm. Tachycardia present.     Pulses: Normal pulses.     Heart sounds: Normal heart sounds. No murmur.  Pulmonary:     Effort: Pulmonary effort is normal. No respiratory distress.     Breath sounds: Normal breath sounds. No wheezing, rhonchi or rales.  Abdominal:     General: Abdomen is flat. Bowel sounds are normal. There is no distension.     Palpations: Abdomen is soft. There is no mass.     Tenderness: There is no abdominal tenderness. There is no guarding or rebound.     Hernia: No hernia  is present.  Musculoskeletal:     Right lower leg: No edema.     Left lower leg: No edema.  Neurological:     Mental Status: He is alert.  Psychiatric:        Mood and Affect: Mood normal.        Behavior: Behavior normal.        EKG - sinus arrhythmia rate 90, normal axis, intervals, no acute ST/T changes.  Assessment & Plan:  This visit occurred during the SARS-CoV-2 public health emergency.  Safety protocols were in place, including screening questions prior to the visit, additional usage of staff PPE, and extensive cleaning of exam room while observing appropriate contact time as indicated for disinfecting solutions.   Problem List Items Addressed This Visit    Palpitation - Primary    EKG today - sinus arrhythmia. Start metoprolol 25mg  BID. Discussed if ongoing palpitations, low threshold to refer to cards to consider holter monitor. Pt agrees with plan. Check labwork today.       Relevant Orders   TSH   Comprehensive metabolic panel   CBC with Differential/Platelet   EKG 12-Lead (Completed)   POCT Glucose (Device for Home Use) (Completed)   OSA on CPAP    Compliant with nightly CPAP.  This increases afib risk.       Morbid obesity with BMI of 40.0-44.9, adult ([place])    Encouraged healthy diet and lifestyle changes to affect sustainable weight loss. Reviewed AHA aerobic exercise recommendations.       Essential hypertension    Multiple elevated readings today in setting of general malaise over the last 2 weeks which could have been due to BP elevation. EKG today. Labs today. rec start metoprolol tartrate 25mg  BID to help with tachycardia as well as BP control. Provided with DASH diet. RTC 3-4 wks close BP f/u. Caution with beta blocker in asthma history.       Relevant Medications   metoprolol tartrate (LOPRESSOR) 25 MG tablet       Meds ordered this encounter  Medications  . metoprolol tartrate (LOPRESSOR) 25 MG tablet    Sig: Take 1 tablet (25 mg total) by mouth  2 (two) times daily.    Dispense:  60 tablet    Refill:  3   Orders Placed This Encounter  Procedures  . TSH  . Comprehensive metabolic panel  . CBC with Differential/Platelet  . POCT Glucose (Device for Home Use)  . EKG 12-Lead  Patient instructions: Labs and EKG today  Continue to push water to stay well hydrated. Start metoprolol 25mg  twice daily.  Return in 3-4 weeks for follow up visit, let know sooner if any trouble tolerating medicine. May schedule physical at that time if able.   Your goal blood pressure is <140/90, but lower does better in the long term. Work on low salt/sodium diet - goal <2gm (2000mg ) per day. Eat a diet high in fruits/vegetables and whole grains.  Look into mediterranean and DASH diet. Goal activity is 16min/wk of moderate intensity exercise.  This can be split into 30 minute chunks.  If you are not at this level, you can start with smaller 10-15 min increments and slowly build up activity. Look at www.heart.org for more resources   Follow up plan: Return in about 3 weeks (around 04/02/2019), or if symptoms worsen or fail to improve, for annual exam, prior fasting for blood work, follow up visit.  45m, MD

## 2019-03-12 NOTE — Telephone Encounter (Signed)
Will see today.  

## 2019-03-12 NOTE — Assessment & Plan Note (Addendum)
EKG today - sinus arrhythmia. Start metoprolol 25mg  BID. Discussed if ongoing palpitations, low threshold to refer to cards to consider holter monitor. Pt agrees with plan. Check labwork today. Lungs clear. Not consistent with PE.

## 2019-03-12 NOTE — Patient Instructions (Addendum)
Labs and EKG today  Continue to push water to stay well hydrated. Start metoprolol 25mg  twice daily.  Return in 3-4 weeks for follow up visit, let know sooner if any trouble tolerating medicine. May schedule physical at that time if able.   Your goal blood pressure is <140/90, but lower does better in the long term. Work on low salt/sodium diet - goal <2gm (2000mg ) per day. Eat a diet high in fruits/vegetables and whole grains.  Look into mediterranean and DASH diet. Goal activity is 136min/wk of moderate intensity exercise.  This can be split into 30 minute chunks.  If you are not at this level, you can start with smaller 10-15 min increments and slowly build up activity. Look at www.heart.org for more resources   DASH Eating Plan DASH stands for "Dietary Approaches to Stop Hypertension." The DASH eating plan is a healthy eating plan that has been shown to reduce high blood pressure (hypertension). It may also reduce your risk for type 2 diabetes, heart disease, and stroke. The DASH eating plan may also help with weight loss. What are tips for following this plan?  General guidelines  Avoid eating more than 2,300 mg (milligrams) of salt (sodium) a day. If you have hypertension, you may need to reduce your sodium intake to 1,500 mg a day.  Limit alcohol intake to no more than 1 drink a day for nonpregnant women and 2 drinks a day for men. One drink equals 12 oz of beer, 5 oz of wine, or 1 oz of hard liquor.  Work with your health care provider to maintain a healthy body weight or to lose weight. Ask what an ideal weight is for you.  Get at least 30 minutes of exercise that causes your heart to beat faster (aerobic exercise) most days of the week. Activities may include walking, swimming, or biking.  Work with your health care provider or diet and nutrition specialist (dietitian) to adjust your eating plan to your individual calorie needs. Reading food labels   Check food labels for  the amount of sodium per serving. Choose foods with less than 5 percent of the Daily Value of sodium. Generally, foods with less than 300 mg of sodium per serving fit into this eating plan.  To find whole grains, look for the word "whole" as the first word in the ingredient list. Shopping  Buy products labeled as "low-sodium" or "no salt added."  Buy fresh foods. Avoid canned foods and premade or frozen meals. Cooking  Avoid adding salt when cooking. Use salt-free seasonings or herbs instead of table salt or sea salt. Check with your health care provider or pharmacist before using salt substitutes.  Do not fry foods. Cook foods using healthy methods such as baking, boiling, grilling, and broiling instead.  Cook with heart-healthy oils, such as olive, canola, soybean, or sunflower oil. Meal planning  Eat a balanced diet that includes: ? 5 or more servings of fruits and vegetables each day. At each meal, try to fill half of your plate with fruits and vegetables. ? Up to 6-8 servings of whole grains each day. ? Less than 6 oz of lean meat, poultry, or fish each day. A 3-oz serving of meat is about the same size as a deck of cards. One egg equals 1 oz. ? 2 servings of low-fat dairy each day. ? A serving of nuts, seeds, or beans 5 times each week. ? Heart-healthy fats. Healthy fats called Omega-3 fatty acids are found in  foods such as flaxseeds and coldwater fish, like sardines, salmon, and mackerel.  Limit how much you eat of the following: ? Canned or prepackaged foods. ? Food that is high in trans fat, such as fried foods. ? Food that is high in saturated fat, such as fatty meat. ? Sweets, desserts, sugary drinks, and other foods with added sugar. ? Full-fat dairy products.  Do not salt foods before eating.  Try to eat at least 2 vegetarian meals each week.  Eat more home-cooked food and less restaurant, buffet, and fast food.  When eating at a restaurant, ask that your food be  prepared with less salt or no salt, if possible. What foods are recommended? The items listed may not be a complete list. Talk with your dietitian about what dietary choices are best for you. Grains Whole-grain or whole-wheat bread. Whole-grain or whole-wheat pasta. Brown rice. [place]. Bulgur. Whole-grain and low-sodium cereals. Pita bread. Low-fat, low-sodium crackers. Whole-wheat flour tortillas. Vegetables Fresh or frozen vegetables (raw, steamed, roasted, or grilled). Low-sodium or reduced-sodium tomato and vegetable juice. Low-sodium or reduced-sodium tomato sauce and tomato paste. Low-sodium or reduced-sodium canned vegetables. Fruits All fresh, dried, or frozen fruit. Canned fruit in natural juice (without added sugar). Meat and other protein foods Skinless chicken or [place]. Ground chicken or [place]. Pork with fat trimmed off. Fish and seafood. Egg whites. Dried beans, peas, or lentils. Unsalted nuts, nut butters, and seeds. Unsalted canned beans. Lean cuts of beef with fat trimmed off. Low-sodium, lean deli meat. Dairy Low-fat (1%) or fat-free (skim) milk. Fat-free, low-fat, or reduced-fat cheeses. Nonfat, low-sodium ricotta or cottage cheese. Low-fat or nonfat yogurt. Low-fat, low-sodium cheese. Fats and oils Soft margarine without trans fats. Vegetable oil. Low-fat, reduced-fat, or light mayonnaise and salad dressings (reduced-sodium). Canola, safflower, olive, soybean, and sunflower oils. Avocado. Seasoning and other foods Herbs. Spices. Seasoning mixes without salt. Unsalted popcorn and pretzels. Fat-free sweets. What foods are not recommended? The items listed may not be a complete list. Talk with your dietitian about what dietary choices are best for you. Grains Baked goods made with fat, such as croissants, muffins, or some breads. Dry pasta or rice meal packs. Vegetables Creamed or fried vegetables. Vegetables in a cheese sauce. Regular canned vegetables (not  low-sodium or reduced-sodium). Regular canned tomato sauce and paste (not low-sodium or reduced-sodium). Regular tomato and vegetable juice (not low-sodium or reduced-sodium). Angie Fava. Olives. Fruits Canned fruit in a light or heavy syrup. Fried fruit. Fruit in cream or butter sauce. Meat and other protein foods Fatty cuts of meat. Ribs. Fried meat. Berniece Salines. Sausage. Bologna and other processed lunch meats. Salami. Fatback. Hotdogs. Bratwurst. Salted nuts and seeds. Canned beans with added salt. Canned or smoked fish. Whole eggs or egg yolks. Chicken or [place] with skin. Dairy Whole or 2% milk, cream, and half-and-half. Whole or full-fat cream cheese. Whole-fat or sweetened yogurt. Full-fat cheese. Nondairy creamers. Whipped toppings. Processed cheese and cheese spreads. Fats and oils Butter. Stick margarine. Lard. Shortening. Ghee. Bacon fat. Tropical oils, such as coconut, palm kernel, or palm oil. Seasoning and other foods Salted popcorn and pretzels. Onion salt, garlic salt, seasoned salt, table salt, and sea salt. Worcestershire sauce. Tartar sauce. Barbecue sauce. Teriyaki sauce. Soy sauce, including reduced-sodium. Steak sauce. Canned and packaged gravies. Fish sauce. Oyster sauce. Cocktail sauce. Horseradish that you find on the shelf. Ketchup. Mustard. Meat flavorings and tenderizers. Bouillon cubes. Hot sauce and Tabasco sauce. Premade or packaged marinades. Premade or packaged taco seasonings. Relishes. Regular  salad dressings. Where to find more information:  National Heart, Lung, and [place]: https://wilson-eaton.com/  American Heart Association: www.heart.org Summary  The DASH eating plan is a healthy eating plan that has been shown to reduce high blood pressure (hypertension). It may also reduce your risk for type 2 diabetes, heart disease, and stroke.  With the DASH eating plan, you should limit salt (sodium) intake to 2,300 mg a day. If you have hypertension, you may need to reduce  your sodium intake to 1,500 mg a day.  When on the DASH eating plan, aim to eat more fresh fruits and vegetables, whole grains, lean proteins, low-fat dairy, and heart-healthy fats.  Work with your health care provider or diet and nutrition specialist (dietitian) to adjust your eating plan to your individual calorie needs. This information is not intended to replace advice given to you by your health care provider. Make sure you discuss any questions you have with your health care provider. Document Revised: 12/13/2016 Document Reviewed: 12/25/2015 Elsevier Patient Education  2020 [employer].

## 2019-03-12 NOTE — Assessment & Plan Note (Addendum)
Compliant with nightly CPAP.  This increases afib risk.

## 2019-03-12 NOTE — Assessment & Plan Note (Addendum)
Multiple elevated readings today in setting of general malaise over the last 2 weeks which could have been due to BP elevation. EKG today. Labs today. rec start metoprolol tartrate 25mg  BID to help with tachycardia as well as BP control. Provided with DASH diet. RTC 3-4 wks close BP f/u. Caution with beta blocker in asthma history.

## 2019-03-12 NOTE — Telephone Encounter (Signed)
Pt already has in office appt with Dr Reece Agar on 03/12/19 at [facility].

## 2019-03-12 NOTE — Telephone Encounter (Signed)
[facility] - Client TELEPHONE ADVICE RECORD [facility] Patient Name: Micheal [name] Gender: Male DOB: [birth year] Age: 29 Y 36 M 5 D Return Phone Number: [phone] (Primary), [phone] (Secondary) [place] [address] Client [facility] - Client Client Site [place] - Day Physician Ria Bush - MD Contact Type Call Who Is Calling Patient / Member / Family / Caregiver Call Type Triage / Clinical Relationship To Patient Self Return Phone Number [phone] (Primary) Chief Complaint Blood Pressure High Reason for Call Symptomatic / Request for Health Information Initial Comment BP 155/99 and feels shaky Translation No Nurse Assessment Nurse: Loletha Grayer, RN, Tammy Date/Time (Eastern Time): 03/12/2019 8:42:11 AM Confirm and document reason for call. If symptomatic, describe symptoms. ---[name] states his BP is 155/99 and shaky. In middle of night his BP was up, finger tips tingling. No fever. Drinking and urinating well. Has the patient had close contact with a person known or suspected to have the novel coronavirus illness OR traveled / lives in area with major community spread (including international travel) in the last 14 days from the onset of symptoms? * If Asymptomatic, screen for exposure and travel within the last 14 days. ---No Does the patient have any new or worsening symptoms? ---Yes Will a triage be completed? ---Yes Related visit to physician within the last 2 weeks? ---No Does the PT have any chronic conditions? (i.e. diabetes, asthma, this includes High risk factors for pregnancy, etc.) ---No Is this a behavioral health or substance abuse call? ---No Guidelines Guideline Title Affirmed Question Affirmed Notes Nurse Date/Time (Eastern Time) High Blood Pressure Systolic BP >= 831 OR Diastolic >= 517 Loletha Grayer, RN, Tammy 03/12/2019 8:45:38 AM Disp. Time  Eilene Ghazi Time) Disposition Final User 03/12/2019 8:51:34 AM SEE PCP WITHIN 3 DAYS Yes Loletha Grayer, RN, Tammy PLEASE NOTE: All timestamps contained within this report are represented as [place] Standard Time. CONFIDENTIALTY NOTICE: This fax transmission is intended only for the addressee. It contains information that is legally privileged, confidential or otherwise protected from use or disclosure. If you are not the intended recipient, you are strictly prohibited from reviewing, disclosing, copying using or disseminating any of this information or taking any action in reliance on or regarding this information. If you have received this fax in error, please notify us immediately by telephone so that we can arrange for its return to [place]. Phone: [phone], Toll-Free: [phone], Fax: [phone] Page: 2 of 2 Call Id: [id number] [place] Disagree/Comply Comply [name] Understands Yes PreDisposition Did not know what to do Care Advice Given Per Guideline SEE PCP WITHIN 3 DAYS: * You need to be seen within 2 or 3 days. Call your doctor (or NP/PA) during regular office hours and make an appointment. A clinic or urgent care center are good places to go for care if your doctor's office is closed or you can't get an appointment. NOTE: If office will be open tomorrow, tell caller to call then, not in 3 days. HIGH BLOOD PRESSURE: * Untreated high blood pressure may cause damage to your heart, brain, kidneys, and eyes. * Treatment of high blood pressure can reduce the risk of stroke, heart attack, and heart failure. LIFESTYLE MODIFICATIONS: * DECREASE SODIUM INTAKE: Aim to eat less than 2.4 g (100 mmol) of sodium each day. Unfortunately 75% of the salt in the average person's diet is in pre-processed foods. * LIMIT ALCOHOL: Limit alcoholic beverages to no more than one drink per day for women  and no more than two drinks for men. A drink is 1.5 oz hard liquor (one shot or jigger; 45 ml), 5 oz wine (small glass; 150  ml), 12 oz beer (one can; 360 ml). * EXERCISE, BE MORE PHYSICALLY ACTIVE: Do at least 30 minutes of aerobic exercise (e.g., brisk walking) most days of the week. Other examples of aerobic activities cycling, jogging, and swimming. CALL BACK IF: * Weakness or numbness of the face, arm or leg on one side of the body occurs * Difficulty walking, difficulty talking, or severe headache occurs * Chest pain or difficulty breathing occurs * Your blood pressure is over 180/110 * You become worse. CARE ADVICE given per High Blood Pressure (Adult) guideline. Referrals REFERRED TO PCP OFFICE

## 2019-03-13 ENCOUNTER — Encounter: Payer: Self-pay | Admitting: Family Medicine

## 2019-03-13 DIAGNOSIS — E8809 Other disorders of plasma-protein metabolism, not elsewhere classified: Secondary | ICD-10-CM | POA: Insufficient documentation

## 2019-03-13 DIAGNOSIS — R779 Abnormality of plasma protein, unspecified: Secondary | ICD-10-CM | POA: Insufficient documentation

## 2019-03-13 LAB — CBC WITH DIFFERENTIAL/PLATELET
Absolute Monocytes: 799 cells/uL (ref 200–950)
Basophils Absolute: 47 cells/uL (ref 0–200)
Basophils Relative: 0.5 %
Eosinophils Absolute: 1730 cells/uL — ABNORMAL HIGH (ref 15–500)
Eosinophils Relative: 18.4 %
HCT: 44.6 % (ref 38.5–50.0)
Hemoglobin: 15.2 g/dL (ref 13.2–17.1)
Lymphs Abs: 1636 cells/uL (ref 850–3900)
MCH: 28.2 pg (ref 27.0–33.0)
MCHC: 34.1 g/dL (ref 32.0–36.0)
MCV: 82.7 fL (ref 80.0–100.0)
MPV: 10.5 fL (ref 7.5–12.5)
Monocytes Relative: 8.5 %
Neutro Abs: 5189 cells/uL (ref 1500–7800)
Neutrophils Relative %: 55.2 %
Platelets: 289 10*3/uL (ref 140–400)
RBC: 5.39 10*6/uL (ref 4.20–5.80)
RDW: 12.5 % (ref 11.0–15.0)
Total Lymphocyte: 17.4 %
WBC: 9.4 10*3/uL (ref 3.8–10.8)

## 2019-03-13 LAB — COMPREHENSIVE METABOLIC PANEL
AG Ratio: 1.5 (calc) (ref 1.0–2.5)
ALT: 35 U/L (ref 9–46)
AST: 20 U/L (ref 10–40)
Albumin: 5 g/dL (ref 3.6–5.1)
Alkaline phosphatase (APISO): 85 U/L (ref 36–130)
BUN: 11 mg/dL (ref 7–25)
CO2: 25 mmol/L (ref 20–32)
Calcium: 10.5 mg/dL — ABNORMAL HIGH (ref 8.6–10.3)
Chloride: 101 mmol/L (ref 98–110)
Creat: 0.89 mg/dL (ref 0.60–1.35)
Globulin: 3.4 g/dL (calc) (ref 1.9–3.7)
Glucose, Bld: 84 mg/dL (ref 65–99)
Potassium: 4.4 mmol/L (ref 3.5–5.3)
Sodium: 138 mmol/L (ref 135–146)
Total Bilirubin: 0.5 mg/dL (ref 0.2–1.2)
Total Protein: 8.4 g/dL — ABNORMAL HIGH (ref 6.1–8.1)

## 2019-03-13 LAB — TSH: TSH: 3.75 mIU/L (ref 0.40–4.50)

## 2019-04-02 ENCOUNTER — Encounter: Payer: Self-pay | Admitting: Family Medicine

## 2019-04-02 ENCOUNTER — Ambulatory Visit (INDEPENDENT_AMBULATORY_CARE_PROVIDER_SITE_OTHER): Payer: 59 | Admitting: Family Medicine

## 2019-04-02 ENCOUNTER — Other Ambulatory Visit: Payer: Self-pay

## 2019-04-02 VITALS — BP 130/84 | HR 82 | Temp 97.5°F | Ht 68.0 in | Wt 288.7 lb

## 2019-04-02 DIAGNOSIS — R002 Palpitations: Secondary | ICD-10-CM

## 2019-04-02 DIAGNOSIS — I10 Essential (primary) hypertension: Secondary | ICD-10-CM

## 2019-04-02 DIAGNOSIS — D7219 Other eosinophilia: Secondary | ICD-10-CM | POA: Diagnosis not present

## 2019-04-02 DIAGNOSIS — R779 Abnormality of plasma protein, unspecified: Secondary | ICD-10-CM

## 2019-04-02 DIAGNOSIS — E8809 Other disorders of plasma-protein metabolism, not elsewhere classified: Secondary | ICD-10-CM

## 2019-04-02 MED ORDER — METOPROLOL TARTRATE 25 MG PO TABS: 25.0000 mg | ORAL_TABLET | Freq: Two times a day (BID) | ORAL | 11 refills | Status: DC

## 2019-04-02 NOTE — Progress Notes (Signed)
This visit was conducted in person.  BP 130/84 (BP Location: Left Arm, Patient Position: Sitting, Cuff Size: Large)   Pulse 82   Temp (!) 97.5 F (36.4 C) (Temporal)   Ht 5\' 8"  (1.727 m)   Wt 288 lb 11.2 oz (131 kg)   SpO2 97%   BMI 43.90 kg/m    CC: 1 mo f/u visit Subjective:    Patient ID: Micheal [name], male    DOB: July 02, 1990, 29 y.o.   MRN: [id number]  HPI: Micheal [name] is a 29 y.o. male presenting on 04/02/2019 for Follow-up (Pt states that he had one episode of palpitations on 03/31/19 and his BP was 144/99, HR 70s. Nothing since that episode, BP range 130/80s. )   See prior note for details. Started on metoprolol 25mg  bid.  Has tolreated this well. Has been feeling well. Wednesday night had episode of racing heart sensation (but HR was 70s) and elevated blood pressure (to 144/99).  This happened after he had fetuccini alfredo pasta. It seems both times he had pasta dish. Wonders if it was too high in sodium.   Average BP at home 130/85, HR 71.      Relevant past medical, surgical, family and social history reviewed and updated as indicated. Interim medical history since our last visit reviewed. Allergies and medications reviewed and updated. Outpatient Medications Prior to Visit  Medication Sig Dispense Refill  . augmented betamethasone dipropionate (DIPROLENE-AF) 0.05 % cream APPLY TO AFFECTED AREA ON ELBOWS TWICE A DAY UNTIL CLEAR, THEN USE AS NEEDED    . calcipotriene (DOVONOX) 0.005 % cream APPLY ON THE SKIN AS DIRECTED TWICE DAILY TO FACE AND FOLD OF SKIN UNTIL CLEAR THEN AS NEEDED    . famotidine (PEPCID) 20 MG tablet Take 1 tablet (20 mg total) by mouth 2 (two) times daily. 30 tablet 0  . ketoconazole (NIZORAL) 2 % shampoo APPLY TO AFFECTED AREA EVERY DAY    . levocetirizine (XYZAL) 5 MG tablet Take 5 mg by mouth every evening.    . metoprolol tartrate (LOPRESSOR) 25 MG tablet Take 1 tablet (25 mg total) by mouth 2 (two) times daily. 60 tablet 3   No  facility-administered medications prior to visit.     Per HPI unless specifically indicated in ROS section below Review of Systems Objective:    BP 130/84 (BP Location: Left Arm, Patient Position: Sitting, Cuff Size: Large)   Pulse 82   Temp (!) 97.5 F (36.4 C) (Temporal)   Ht 5\' 8"  (1.727 m)   Wt 288 lb 11.2 oz (131 kg)   SpO2 97%   BMI 43.90 kg/m   Wt Readings from Last 3 Encounters:  04/02/19 288 lb 11.2 oz (131 kg)  03/12/19 289 lb 7 oz (131.3 kg)  12/15/17 299 lb (135.6 kg)    Physical Exam Vitals and nursing note reviewed.  Constitutional:      Appearance: Normal appearance. He is not ill-appearing.  Eyes:     Extraocular Movements: Extraocular movements intact.     Pupils: Pupils are equal, round, and reactive to light.  Cardiovascular:     Rate and Rhythm: Normal rate and regular rhythm.     Pulses: Normal pulses.     Heart sounds: Normal heart sounds. No murmur.  Pulmonary:     Effort: Pulmonary effort is normal. No respiratory distress.     Breath sounds: Normal breath sounds. No wheezing, rhonchi or rales.  Musculoskeletal:     Right lower leg: No  edema.     Left lower leg: No edema.  Neurological:     Mental Status: He is alert.  Psychiatric:        Mood and Affect: Mood normal.       Results for orders placed or performed in visit on 04/02/19  CBC with Differential/Platelet  Result Value Ref Range   WBC 10.2 3.8 - 10.8 Thousand/uL   RBC 5.41 4.20 - 5.80 Million/uL   Hemoglobin 15.2 13.2 - 17.1 g/dL   HCT 84.1 66.0 - 63.0 %   MCV 83.7 80.0 - 100.0 fL   MCH 28.1 27.0 - 33.0 pg   MCHC 33.6 32.0 - 36.0 g/dL   RDW 16.0 10.9 - 32.3 %   Platelets 280 140 - 400 Thousand/uL   MPV 10.6 7.5 - 12.5 fL   Neutro Abs 5,090 1,500 - 7,800 cells/uL   Lymphs Abs 2,020 850 - 3,900 cells/uL   Absolute Monocytes 541 200 - 950 cells/uL   Eosinophils Absolute 2,489 (H) 15 - 500 cells/uL   Basophils Absolute 61 0 - 200 cells/uL   Neutrophils Relative % 49.9 %    Total Lymphocyte 19.8 %   Monocytes Relative 5.3 %   Eosinophils Relative 24.4 %   Basophils Relative 0.6 %  Comprehensive metabolic panel  Result Value Ref Range   Glucose, Bld 84 65 - 99 mg/dL   BUN 13 7 - 25 mg/dL   Creat 5.57 3.22 - 0.25 mg/dL   BUN/Creatinine Ratio NOT APPLICABLE 6 - 22 (calc)   Sodium 137 135 - 146 mmol/L   Potassium 4.6 3.5 - 5.3 mmol/L   Chloride 100 98 - 110 mmol/L   CO2 25 20 - 32 mmol/L   Calcium 10.3 8.6 - 10.3 mg/dL   Total Protein 8.1 6.1 - 8.1 g/dL   Albumin 4.8 3.6 - 5.1 g/dL   Globulin 3.3 1.9 - 3.7 g/dL (calc)   AG Ratio 1.5 1.0 - 2.5 (calc)   Total Bilirubin 0.5 0.2 - 1.2 mg/dL   Alkaline phosphatase (APISO) 82 36 - 130 U/L   AST 23 10 - 40 U/L   ALT 45 9 - 46 U/L   Assessment & Plan:  This visit occurred during the SARS-CoV-2 public health emergency.  Safety protocols were in place, including screening questions prior to the visit, additional usage of staff PPE, and extensive cleaning of exam room while observing appropriate contact time as indicated for disinfecting solutions.   Problem List Items Addressed This Visit    Palpitation - Primary    Improved on metoprolol.  One episode since last month.  Offered cardiology eval to discuss possible monitor - as significantly improved, he prefers to watch symptoms for now.       Essential hypertension    Doing better on metoprolol 25mg  bid - continue.       Relevant Medications   metoprolol tartrate (LOPRESSOR) 25 MG tablet   Eosinophilia    Persists - check periph smear.  ?eosinophilic esophagitis in GERD hx.       Relevant Orders   CBC with Differential/Platelet (Completed)   Pathologist smear review   Elevated blood protein    Total protein recently elevated- will repeat and check SPEP.       Relevant Orders   Comprehensive metabolic panel (Completed)   Serum protein electrophoresis with reflex       Meds ordered this encounter  Medications  . metoprolol tartrate  (LOPRESSOR) 25 MG tablet    Sig:  Take 1 tablet (25 mg total) by mouth 2 (two) times daily.    Dispense:  60 tablet    Refill:  11   Orders Placed This Encounter  Procedures  . CBC with Differential/Platelet  . Pathologist smear review  . Comprehensive metabolic panel  . Serum protein electrophoresis with reflex   Patient Instructions  Labs today including cholesterol levels. Continue metoprolol 25mg  twice daily.  I'm glad you're doing better Let me know if recurrent episodes of racing heart for cardiology evaluation.    Follow up plan: No follow-ups on file.  , MD

## 2019-04-02 NOTE — Patient Instructions (Addendum)
Labs today including cholesterol levels. Continue metoprolol 25mg  twice daily.  I'm glad you're doing better Let me know if recurrent episodes of racing heart for cardiology evaluation.

## 2019-04-04 ENCOUNTER — Encounter: Payer: Self-pay | Admitting: Family Medicine

## 2019-04-04 NOTE — Assessment & Plan Note (Signed)
Total protein recently elevated- will repeat and check SPEP.

## 2019-04-04 NOTE — Assessment & Plan Note (Addendum)
Persists - check periph smear.  ?eosinophilic esophagitis in GERD hx.

## 2019-04-04 NOTE — Assessment & Plan Note (Signed)
Doing better on metoprolol 25mg  bid - continue.

## 2019-04-04 NOTE — Assessment & Plan Note (Signed)
Improved on metoprolol.  One episode since last month.  Offered cardiology eval to discuss possible monitor - as significantly improved, he prefers to watch symptoms for now.

## 2019-04-05 LAB — CBC WITH DIFFERENTIAL/PLATELET
Absolute Monocytes: 541 cells/uL (ref 200–950)
Basophils Absolute: 61 cells/uL (ref 0–200)
Basophils Relative: 0.6 %
Eosinophils Absolute: 2489 cells/uL — ABNORMAL HIGH (ref 15–500)
Eosinophils Relative: 24.4 %
HCT: 45.3 % (ref 38.5–50.0)
Hemoglobin: 15.2 g/dL (ref 13.2–17.1)
Lymphs Abs: 2020 cells/uL (ref 850–3900)
MCH: 28.1 pg (ref 27.0–33.0)
MCHC: 33.6 g/dL (ref 32.0–36.0)
MCV: 83.7 fL (ref 80.0–100.0)
MPV: 10.6 fL (ref 7.5–12.5)
Monocytes Relative: 5.3 %
Neutro Abs: 5090 cells/uL (ref 1500–7800)
Neutrophils Relative %: 49.9 %
Platelets: 280 10*3/uL (ref 140–400)
RBC: 5.41 10*6/uL (ref 4.20–5.80)
RDW: 12.5 % (ref 11.0–15.0)
Total Lymphocyte: 19.8 %
WBC: 10.2 10*3/uL (ref 3.8–10.8)

## 2019-04-05 LAB — COMPREHENSIVE METABOLIC PANEL
AG Ratio: 1.5 (calc) (ref 1.0–2.5)
ALT: 45 U/L (ref 9–46)
AST: 23 U/L (ref 10–40)
Albumin: 4.8 g/dL (ref 3.6–5.1)
Alkaline phosphatase (APISO): 82 U/L (ref 36–130)
BUN: 13 mg/dL (ref 7–25)
CO2: 25 mmol/L (ref 20–32)
Calcium: 10.3 mg/dL (ref 8.6–10.3)
Chloride: 100 mmol/L (ref 98–110)
Creat: 0.98 mg/dL (ref 0.60–1.35)
Globulin: 3.3 g/dL (calc) (ref 1.9–3.7)
Glucose, Bld: 84 mg/dL (ref 65–99)
Potassium: 4.6 mmol/L (ref 3.5–5.3)
Sodium: 137 mmol/L (ref 135–146)
Total Bilirubin: 0.5 mg/dL (ref 0.2–1.2)
Total Protein: 8.1 g/dL (ref 6.1–8.1)

## 2019-04-05 LAB — LIPID PANEL
Cholesterol: 197 mg/dL (ref <200)
HDL: 36 mg/dL — ABNORMAL LOW (ref >=40)
LDL Cholesterol (Calc): 125 mg/dL (calc) — ABNORMAL HIGH
Non-HDL Cholesterol (Calc): 161 mg/dL (calc) — ABNORMAL HIGH (ref <130)
Total CHOL/HDL Ratio: 5.5 (calc) — ABNORMAL HIGH (ref <5.0)
Triglycerides: 225 mg/dL — ABNORMAL HIGH (ref <150)

## 2019-04-05 LAB — PROTEIN ELECTROPHORESIS, SERUM, WITH REFLEX
Albumin ELP: 4.8 g/dL (ref 3.8–4.8)
Alpha 1: 0.3 g/dL (ref 0.2–0.3)
Alpha 2: 0.7 g/dL (ref 0.5–0.9)
Beta 2: 0.5 g/dL (ref 0.2–0.5)
Beta Globulin: 0.5 g/dL (ref 0.4–0.6)
Gamma Globulin: 1.4 g/dL (ref 0.8–1.7)
Total Protein: 8.2 g/dL — ABNORMAL HIGH (ref 6.1–8.1)

## 2019-04-05 LAB — PATHOLOGIST SMEAR REVIEW

## 2019-04-06 ENCOUNTER — Telehealth: Payer: Self-pay

## 2019-04-06 DIAGNOSIS — D7219 Other eosinophilia: Secondary | ICD-10-CM

## 2019-04-06 NOTE — Telephone Encounter (Signed)
Spoke with patient.   Had another episode last night when trying to fall asleep - did not feel well "uneasiness" - BP 160/100, HR 60. Did not feel palpitations or heart flutter. He was anxious about recent blood work. Some paresthesias. Suggested starting MVI. Consider PRN anxiety medication if recurrent episode (ie hydroxyzine).   Will refer to hematology for further evaluation of chronic eosinophilia.

## 2019-04-06 NOTE — Telephone Encounter (Signed)
Pt left v/m that he reviewed recent labs on mychart notes; pt is concerned of the section of results pathologist smear review; pt wants to know what this means and what to do next. Pt request cb.

## 2019-04-10 NOTE — Progress Notes (Signed)
[facility]  Telephone:(336) [phone] [phone]) [phone]  ID: Micheal [name] OB: October 24, 1990  MR#: [id number]  WVP#:[id number]  Patient Care Team: Eustaquio Boyden, MD as PCP - General (Family Medicine)  CHIEF COMPLAINT: Eosinophilia.  INTERVAL HISTORY: Patient is a 29 year old male who was noted to have an elevated eosinophil count on routine blood work.  Repeat laboratory work confirmed the results.  He is anxious, but otherwise feels well.  He has no neurologic complaints.  He denies any recent fevers or illnesses.  He recently started metoprolol, but thinks he initiated treatment after his eosinophilia was noted.  He has a good appetite and denies weight loss.  He has no night sweats.  He has no chest pain, shortness of breath, cough, or hemoptysis.  He denies any nausea, vomiting, constipation, or diarrhea.  He has no urinary complaints.  Patient otherwise feels well and offers no further specific complaints today.  REVIEW OF SYSTEMS:   Review of Systems  Constitutional: Negative.  Negative for fever, malaise/fatigue and weight loss.  Respiratory: Negative.  Negative for cough and shortness of breath.   Cardiovascular: Negative.  Negative for chest pain and leg swelling.  Gastrointestinal: Negative.  Negative for abdominal pain, blood in stool and melena.  Genitourinary: Negative.  Negative for hematuria.  Musculoskeletal: Negative.  Negative for back pain.  Skin: Negative.  Negative for rash.  Neurological: Negative.  Negative for dizziness, seizures, weakness and headaches.  Psychiatric/Behavioral: The patient is nervous/anxious.     As per HPI. Otherwise, a complete review of systems is negative.  PAST MEDICAL HISTORY: Past Medical History:  Diagnosis Date  . Childhood asthma   . Perennial allergic rhinitis   . Psoriasis     PAST SURGICAL HISTORY: Past Surgical History:  Procedure Laterality Date  . WISDOM TOOTH EXTRACTION      FAMILY  HISTORY: Family History  Problem Relation Age of Onset  . Valvular heart disease Father        bicuspid aortic valve s/p replacement  . Cancer Maternal Grandfather        pancreatic  . Diabetes Paternal Grandmother   . Hypertension Paternal Grandmother   . CAD Neg Hx   . Stroke Neg Hx     ADVANCED DIRECTIVES (Y/N):  N  HEALTH MAINTENANCE: Social History   Tobacco Use  . Smoking status: Never Smoker  . Smokeless tobacco: Never Used  Substance Use Topics  . Alcohol use: No  . Drug use: No     Colonoscopy:  PAP:  Bone density:  Lipid panel:  No Known Allergies  Current Outpatient Medications  Medication Sig Dispense Refill  . augmented betamethasone dipropionate (DIPROLENE-AF) 0.05 % cream APPLY TO AFFECTED AREA ON ELBOWS TWICE A DAY UNTIL CLEAR, THEN USE AS NEEDED    . calcipotriene (DOVONOX) 0.005 % cream APPLY ON THE SKIN AS DIRECTED TWICE DAILY TO FACE AND FOLD OF SKIN UNTIL CLEAR THEN AS NEEDED    . famotidine (PEPCID) 20 MG tablet Take 1 tablet (20 mg total) by mouth 2 (two) times daily. (Patient taking differently: Take 20 mg by mouth daily. ) 30 tablet 0  . ketoconazole (NIZORAL) 2 % shampoo APPLY TO AFFECTED AREA EVERY DAY    . levocetirizine (XYZAL) 5 MG tablet Take 5 mg by mouth every evening.    . metoprolol tartrate (LOPRESSOR) 25 MG tablet Take 1 tablet (25 mg total) by mouth 2 (two) times daily. 60 tablet 11   No current facility-administered medications for this visit.  OBJECTIVE: Vitals:   04/15/19 1513  BP: (!) 156/69  Pulse: 74  Resp: 18  Temp: (!) 96.2 F (35.7 C)  SpO2: 100%     Body mass index is 44.78 kg/m.    ECOG FS:0 - Asymptomatic  General: Well-developed, well-nourished, no acute distress. Eyes: Pink conjunctiva, anicteric sclera. HEENT: Normocephalic, moist mucous membranes. Lungs: No audible wheezing or coughing. Heart: Regular rate and rhythm. Abdomen: Soft, nontender, no obvious distention. Musculoskeletal: No edema,  cyanosis, or clubbing. Neuro: Alert, answering all questions appropriately. Cranial nerves grossly intact. Skin: No rashes or petechiae noted. Psych: Normal affect. Lymphatics: No cervical, calvicular, axillary or inguinal LAD.   LAB RESULTS:  Lab Results  Component Value Date   NA 137 04/02/2019   K 4.6 04/02/2019   CL 100 04/02/2019   CO2 25 04/02/2019   GLUCOSE 84 04/02/2019   BUN 13 04/02/2019   CREATININE 0.98 04/02/2019   CALCIUM 10.3 04/02/2019   PROT 8.1 04/02/2019   PROT 8.2 (H) 04/02/2019   ALBUMIN 5.0 12/09/2017   AST 23 04/02/2019   ALT 45 04/02/2019   ALKPHOS 83 12/09/2017   BILITOT 0.5 04/02/2019   GFRNONAA >60 12/09/2017   GFRAA >60 12/09/2017    Lab Results  Component Value Date   WBC 10.4 04/15/2019   NEUTROABS 4.6 04/15/2019   HGB 15.4 04/15/2019   HCT 46.1 04/15/2019   MCV 84.6 04/15/2019   PLT 265 04/15/2019     STUDIES: No results found.  ASSESSMENT: Eosinophilia  PLAN:    1. Eosinophilia: Patient's absolute eosinophil count continues to increase and is now 2.9.  He continues to have a normal total white blood cell count.  Patient has eczema as well as seasonal allergies that may be contributing to his eosinophilia.  Have ordered peripheral blood flow cytometry for further evaluation.  Patient will have a video assisted telemedicine visit in 2 weeks to discuss the results and additional diagnostic planning if necessary.  I spent a total of 45 minutes reviewing chart data, face-to-face evaluation with the patient, counseling and coordination of care as detailed above.   Patient expressed understanding and was in agreement with this plan. He also understands that He can call clinic at any time with any questions, concerns, or complaints.    Lloyd Huger, MD   04/15/2019 4:16 PM

## 2019-04-14 ENCOUNTER — Encounter: Payer: Self-pay | Admitting: Oncology

## 2019-04-14 NOTE — Progress Notes (Signed)
New patient being seen for increased eosinophils. Patient would like to discuss what could be causing this. Patient denied any pain or other concerns at this time.

## 2019-04-15 ENCOUNTER — Encounter: Payer: Self-pay | Admitting: Oncology

## 2019-04-15 ENCOUNTER — Other Ambulatory Visit: Payer: Self-pay

## 2019-04-15 ENCOUNTER — Inpatient Hospital Stay: Payer: 59 | Attending: Oncology

## 2019-04-15 ENCOUNTER — Inpatient Hospital Stay: Payer: 59 | Attending: Oncology | Admitting: Oncology

## 2019-04-15 VITALS — BP 156/69 | HR 74 | Temp 96.2°F | Resp 18 | Wt 294.5 lb

## 2019-04-15 DIAGNOSIS — D7219 Other eosinophilia: Secondary | ICD-10-CM | POA: Insufficient documentation

## 2019-04-15 DIAGNOSIS — D721 Eosinophilia, unspecified: Secondary | ICD-10-CM | POA: Insufficient documentation

## 2019-04-15 DIAGNOSIS — Z79899 Other long term (current) drug therapy: Secondary | ICD-10-CM | POA: Insufficient documentation

## 2019-04-15 LAB — CBC WITH DIFFERENTIAL/PLATELET
Abs Immature Granulocytes: 0.03 10*3/uL (ref 0.00–0.07)
Basophils Absolute: 0.1 10*3/uL (ref 0.0–0.1)
Basophils Relative: 1 %
Eosinophils Absolute: 2.9 10*3/uL — ABNORMAL HIGH (ref 0.0–0.5)
Eosinophils Relative: 28 %
HCT: 46.1 % (ref 39.0–52.0)
Hemoglobin: 15.4 g/dL (ref 13.0–17.0)
Immature Granulocytes: 0 %
Lymphocytes Relative: 20 %
Lymphs Abs: 2.1 10*3/uL (ref 0.7–4.0)
MCH: 28.3 pg (ref 26.0–34.0)
MCHC: 33.4 g/dL (ref 30.0–36.0)
MCV: 84.6 fL (ref 80.0–100.0)
Monocytes Absolute: 0.6 10*3/uL (ref 0.1–1.0)
Monocytes Relative: 6 %
Neutro Abs: 4.6 10*3/uL (ref 1.7–7.7)
Neutrophils Relative %: 45 %
Platelets: 265 10*3/uL (ref 150–400)
RBC: 5.45 MIL/uL (ref 4.22–5.81)
RDW: 12 % (ref 11.5–15.5)
WBC: 10.4 10*3/uL (ref 4.0–10.5)
nRBC: 0 % (ref 0.0–0.2)

## 2019-04-15 LAB — LACTATE DEHYDROGENASE: LDH: 146 U/L (ref 98–192)

## 2019-04-19 LAB — COMP PANEL: LEUKEMIA/LYMPHOMA

## 2019-04-23 NOTE — Progress Notes (Signed)
[place]  Telephone:(336) [phone] Fax:(336) [phone]  ID: Micheal [name] OB: 1990-05-12  MR#: [id number]  FBP#:[id number]  Patient Care Team: Ria Bush, MD as PCP - General (Family Medicine)  I connected with Micheal [name] on 04/29/19 at 10:45 AM EDT by video enabled telemedicine visit and verified that I am speaking with the correct person using two identifiers.   I discussed the limitations, risks, security and privacy concerns of performing an evaluation and management service by telemedicine and the availability of in-person appointments. I also discussed with the patient that there may be a patient responsible charge related to this service. The patient expressed understanding and agreed to proceed.   Other persons participating in the visit and their role in the encounter: Patient, MD.  Patient's location: Home. Provider's location: Clinic.  CHIEF COMPLAINT: Eosinophilia.  INTERVAL HISTORY: Patient agreed to video assisted telemedicine visit for further evaluation and discussion of his laboratory results.  He continues to feel well and remains asymptomatic. He has no neurologic complaints.  He denies any recent fevers or illnesses.  He has a good appetite and denies weight loss.  He has no night sweats.  He has no chest pain, shortness of breath, cough, or hemoptysis.  He denies any nausea, vomiting, constipation, or diarrhea.  He has no urinary complaints.  Patient feels at his baseline offers no specific complaints today.  REVIEW OF SYSTEMS:   Review of Systems  Constitutional: Negative.  Negative for fever, malaise/fatigue and weight loss.  Respiratory: Negative.  Negative for cough and shortness of breath.   Cardiovascular: Negative.  Negative for chest pain and leg swelling.  Gastrointestinal: Negative.  Negative for abdominal pain, blood in stool and melena.  Genitourinary: Negative.  Negative for hematuria.  Musculoskeletal: Negative.  Negative  for back pain.  Skin: Negative.  Negative for rash.  Neurological: Negative.  Negative for dizziness, seizures, weakness and headaches.  Psychiatric/Behavioral: Negative.  The patient is not nervous/anxious.     As per HPI. Otherwise, a complete review of systems is negative.  PAST MEDICAL HISTORY: Past Medical History:  Diagnosis Date  . Childhood asthma   . Perennial allergic rhinitis   . Psoriasis     PAST SURGICAL HISTORY: Past Surgical History:  Procedure Laterality Date  . WISDOM TOOTH EXTRACTION      FAMILY HISTORY: Family History  Problem Relation Age of Onset  . Valvular heart disease Father        bicuspid aortic valve s/p replacement  . Cancer Maternal Grandfather        pancreatic  . Diabetes Paternal Grandmother   . Hypertension Paternal Grandmother   . CAD Neg Hx   . Stroke Neg Hx     ADVANCED DIRECTIVES (Y/N):  N  HEALTH MAINTENANCE: Social History   Tobacco Use  . Smoking status: Never Smoker  . Smokeless tobacco: Never Used  Substance Use Topics  . Alcohol use: No  . Drug use: No     Colonoscopy:  PAP:  Bone density:  Lipid panel:  No Known Allergies  Current Outpatient Medications  Medication Sig Dispense Refill  . augmented betamethasone dipropionate (DIPROLENE-AF) 0.05 % cream APPLY TO AFFECTED AREA ON ELBOWS TWICE A DAY UNTIL CLEAR, THEN USE AS NEEDED    . calcipotriene (DOVONOX) 0.005 % cream APPLY ON THE SKIN AS DIRECTED TWICE DAILY TO FACE AND FOLD OF SKIN UNTIL CLEAR THEN AS NEEDED    . famotidine (PEPCID) 20 MG tablet Take 1 tablet (20  mg total) by mouth 2 (two) times daily. (Patient taking differently: Take 20 mg by mouth daily. ) 30 tablet 0  . ketoconazole (NIZORAL) 2 % shampoo APPLY TO AFFECTED AREA EVERY DAY    . levocetirizine (XYZAL) 5 MG tablet Take 5 mg by mouth every evening.    . metoprolol tartrate (LOPRESSOR) 25 MG tablet Take 1 tablet (25 mg total) by mouth 2 (two) times daily. 60 tablet 11   No current  facility-administered medications for this visit.    OBJECTIVE: There were no vitals filed for this visit.   There is no height or weight on file to calculate BMI.    ECOG FS:0 - Asymptomatic   LAB RESULTS:  Lab Results  Component Value Date   NA 137 04/02/2019   K 4.6 04/02/2019   CL 100 04/02/2019   CO2 25 04/02/2019   GLUCOSE 84 04/02/2019   BUN 13 04/02/2019   CREATININE 0.98 04/02/2019   CALCIUM 10.3 04/02/2019   PROT 8.1 04/02/2019   PROT 8.2 (H) 04/02/2019   ALBUMIN 5.0 12/09/2017   AST 23 04/02/2019   ALT 45 04/02/2019   ALKPHOS 83 12/09/2017   BILITOT 0.5 04/02/2019   GFRNONAA >60 12/09/2017   GFRAA >60 12/09/2017    Lab Results  Component Value Date   WBC 10.4 04/15/2019   NEUTROABS 4.6 04/15/2019   HGB 15.4 04/15/2019   HCT 46.1 04/15/2019   MCV 84.6 04/15/2019   PLT 265 04/15/2019     STUDIES: No results found.  ASSESSMENT: Eosinophilia  PLAN:    1. Eosinophilia: Patient's absolute eosinophil count continues to increase and is now 2.9.  He continues to have a normal total white blood cell count.  Patient has eczema as well as seasonal allergies that may be contributing to his eosinophilia.  All of his other blood work including peripheral blood flow cytometry was either negative or within normal limits.  No intervention is needed at this time.  Patient does not require bone marrow biopsy.  Will order molecular testing for PDGFRA, PDGFRB, and FGR1 to assess for hypereosinophilic syndrome at next clinic visit.  Return to clinic in 3 months with repeat laboratory work and video assisted telemedicine visit.    I provided 20 minutes of face-to-face video visit time during this encounter which included chart review, counseling, and coordination of care as documented above.   Patient expressed understanding and was in agreement with this plan. He also understands that He can call clinic at any time with any questions, concerns, or complaints.    Lloyd Huger, MD   04/29/2019 11:22 AM

## 2019-04-29 ENCOUNTER — Encounter: Payer: Self-pay | Admitting: Oncology

## 2019-04-29 ENCOUNTER — Inpatient Hospital Stay (HOSPITAL_BASED_OUTPATIENT_CLINIC_OR_DEPARTMENT_OTHER): Payer: 59 | Admitting: Oncology

## 2019-04-29 DIAGNOSIS — D7219 Other eosinophilia: Secondary | ICD-10-CM | POA: Diagnosis not present

## 2019-07-01 ENCOUNTER — Telehealth: Payer: Self-pay | Admitting: Family Medicine

## 2019-07-01 DIAGNOSIS — R002 Palpitations: Secondary | ICD-10-CM

## 2019-07-01 NOTE — Telephone Encounter (Signed)
Recommend cardiology eval for ongoing palpitations despite metoprolol 25mg  bid.  Will see tomorrow for conjunctivitis.

## 2019-07-01 NOTE — Telephone Encounter (Signed)
Pt will need OV.  Plz call to schedule.

## 2019-07-01 NOTE — Telephone Encounter (Signed)
Patient called and left a voicemail regarding his Metoprolol. Pt wanted to give an update on his BP and HR.  Pt states that the last 2-3 nights he has been waking up with HR racing/palpitations and elevated BP BP last night was 150/102, HR 72 Pt states that when he lays down he can feel his heart rhythm change and his BP start rising. Not sure if palpitations or just heart racing.  Pt states that his BP this AM was 144/88, HR 70s  Pt states that he is not sure if his medication needs to be changed or additional meds need to be added.

## 2019-07-01 NOTE — Telephone Encounter (Signed)
Patient called.  Patient scheduled appointment on 07/02/19 at 9:00.

## 2019-07-01 NOTE — Telephone Encounter (Signed)
Patient called and left a voicemail regarding having possible pink eye.  Pt states that his left eye is red, itchy, some discharge noted and little swelling.   Wanting to know if something can be called in.   Please advise, thanks.

## 2019-07-01 NOTE — Addendum Note (Signed)
Addended by: Eustaquio Boyden on: 07/01/2019 11:45 PM   Modules accepted: Orders

## 2019-07-02 ENCOUNTER — Other Ambulatory Visit: Payer: Self-pay

## 2019-07-02 ENCOUNTER — Encounter: Payer: Self-pay | Admitting: Family Medicine

## 2019-07-02 ENCOUNTER — Ambulatory Visit (INDEPENDENT_AMBULATORY_CARE_PROVIDER_SITE_OTHER): Payer: 59 | Admitting: Family Medicine

## 2019-07-02 VITALS — BP 138/82 | HR 86 | Temp 97.6°F | Ht 68.5 in | Wt 289.0 lb

## 2019-07-02 DIAGNOSIS — J3089 Other allergic rhinitis: Secondary | ICD-10-CM

## 2019-07-02 DIAGNOSIS — R002 Palpitations: Secondary | ICD-10-CM | POA: Diagnosis not present

## 2019-07-02 DIAGNOSIS — D7219 Other eosinophilia: Secondary | ICD-10-CM | POA: Diagnosis not present

## 2019-07-02 DIAGNOSIS — H1013 Acute atopic conjunctivitis, bilateral: Secondary | ICD-10-CM | POA: Insufficient documentation

## 2019-07-02 MED ORDER — OLOPATADINE HCL 0.1 % OP SOLN: 1.0000 [drp] | Freq: Two times a day (BID) | OPHTHALMIC | 1 refills | Status: DC

## 2019-07-02 NOTE — Assessment & Plan Note (Signed)
Ongoing, seem positional (only when supine).  Improved with metoprolol however given ongoing symptoms reasonable to get cardiology input. Pt agrees with plan.

## 2019-07-02 NOTE — Patient Instructions (Addendum)
Let's refer you to cardiology for ongoing racing heart feeling worse when laying down.  I think eye symptoms are more allergy related. Treat with cool compresses as well as patanol antihistamine eye drops sent to pharmacy.   Allergic Conjunctivitis, Adult  Allergic conjunctivitis is inflammation of the clear membrane that covers the white part of your eye and the inner surface of your eyelid (conjunctiva). The inflammation is caused by allergies. The blood vessels in the conjunctiva become inflamed and this causes the eyes to become red or pink. The eyes often feel itchy. Allergic conjunctivitis cannot be spread from one person to another person (is not contagious). What are the causes? This condition is caused by an allergic reaction. Common causes of an allergic reaction (allergens) include:  Outdoor allergens, such as: ? Pollen. ? Grass and weeds. ? Mold spores.  Indoor allergens, such as: ? Dust. ? Smoke. ? Mold. ? Pet dander. ? Animal hair. What increases the risk? You may be more likely to develop this condition if you have a family history of allergies, such as:  Allergic rhinitis.  Bronchial asthma.  Atopic dermatitis. What are the signs or symptoms? Symptoms of this condition include eyes that are:  Itchy.  Red.  Watery.  Puffy. Your eyes may also:  Sting or burn.  Have clear drainage coming from them. How is this diagnosed? This condition may be diagnosed by medical history and physical exam. If you have drainage from your eyes, it may be tested to rule out other causes of conjunctivitis. You may also need to see a health care provider who specializes in treating allergies (allergist) or eye conditions (ophthalmologist) for tests to confirm the diagnosis. You may have:  Skin tests to see which allergens are causing your symptoms. These tests involve pricking the skin with a tiny needle and exposing the skin to small amounts of potential allergens to see if your  skin reacts.  Blood tests.  Tissue scrapings from your eyelid. These will be examined under a microscope. How is this treated? Treatments for this condition may include:  Cold cloths (compresses) to soothe itching and swelling.  Washing the face to remove allergens.  Eye drops. These may be prescription or over-the-counter. There are several different types. You may need to try different types to see which one works best for you. Your may need: ? Eye drops that block the allergic reaction (antihistamine). ? Eye drops that reduce swelling and irritation (anti-inflammatory). ? Steroid eye drops to lessen a severe reaction (vernal conjunctivitis).  Oral antihistamine medicines to reduce your allergic reaction. You may need these if eye drops do not help or are difficult to use. Follow these instructions at home:  Avoid known allergens whenever possible.  Take or apply over-the-counter and prescription medicines only as told by your health care provider. These include any eye drops.  Apply a cool, clean washcloth to your eye for 10-20 minutes, 3-4 times a day.  Do not touch or rub your eyes.  Do not wear contact lenses until the inflammation is gone. Wear glasses instead.  Do not wear eye makeup until the inflammation is gone.  Keep all follow-up visits as told by your health care provider. This is important. Contact a health care provider if:  Your symptoms get worse or do not improve with treatment.  You have mild eye pain.  You have sensitivity to light.  You have spots or blisters on your eyes.  You have pus draining from your eye.  You have a fever. Get help right away if:  You have redness, swelling, or other symptoms in only one eye.  Your vision is blurred or you have vision changes.  You have severe eye pain. This information is not intended to replace advice given to you by your health care provider. Make sure you discuss any questions you have with your  health care provider. Document Revised: 12/13/2016 Document Reviewed: 07/14/2015 Elsevier Patient Education  2020 [employer].

## 2019-07-02 NOTE — Assessment & Plan Note (Signed)
Regularly on xyzal.

## 2019-07-02 NOTE — Assessment & Plan Note (Signed)
In h/o allergic rhinitis. He is allergic to trees and mold. He has been outdoors recently. Anticipate allergic conjunctivitis. Reviewed allergen avoidance measures. Treat with cool compresses and patanol antihistamine eye drops. Update if not improving with treatment.  Less likely viral conjunctivitis.

## 2019-07-02 NOTE — Assessment & Plan Note (Signed)
Thought allergic - seeing heme.

## 2019-07-02 NOTE — Telephone Encounter (Signed)
Pt have an been seen today with Dr. Sharen Hones

## 2019-07-02 NOTE — Progress Notes (Signed)
This visit was conducted in person.  BP 138/82   Pulse 86   Temp 97.6 F (36.4 C)   Ht 5' 8.5" (1.74 m)   Wt 289 lb (131.1 kg)   SpO2 98%   BMI 43.30 kg/m    CC: ongoing palpitations, check eye Subjective:    Patient ID: Micheal [name], male    DOB: August 17, 1990, 29 y.o.   MRN: [id number]  HPI: Micheal [name] is a 29 y.o. male presenting on 07/02/2019 for Follow-up (pt stated--doing ok sometimes the heart still beating fast when laying down)   See prior notes for details.  Last visit started metoprolol 25mg  bid for palpitations. Overall stable period however recently increasing palpitations noted - especially when laying down at bedtime. Has racing heart feeling without chest pain or dyspnea or orthopnea - hypertensive when checked 140-150/88-100. BP does start going back down after a few minutes. Last few nights he's slept in recliner - does better when not supine. Sleeps with 1 pillow at night. Known OSA on CPAP at setting of 12 or 15.   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  Known psoriasis.   Bilateral red itchy eyes for past 3 days - L then R. Itching > pain. Awoke over last several days with crusting of eyes. No sick contacts or pink eye at home. Tried lubricating eye drops without benefit. No pain, no drainage. Allergies overall stable.      Relevant past medical, surgical, family and social history reviewed and updated as indicated. Interim medical history since our last visit reviewed. Allergies and medications reviewed and updated. Outpatient Medications Prior to Visit  Medication Sig Dispense Refill  . augmented betamethasone dipropionate (DIPROLENE-AF) 0.05 % cream APPLY TO AFFECTED AREA ON ELBOWS TWICE A DAY UNTIL CLEAR, THEN USE AS NEEDED    . calcipotriene (DOVONOX) 0.005 % cream APPLY ON THE SKIN AS DIRECTED TWICE DAILY TO FACE AND FOLD OF SKIN UNTIL CLEAR THEN AS NEEDED    . famotidine (PEPCID) 20 MG tablet Take 1  tablet (20 mg total) by mouth 2 (two) times daily. (Patient taking differently: Take 20 mg by mouth daily. ) 30 tablet 0  . ketoconazole (NIZORAL) 2 % shampoo APPLY TO AFFECTED AREA EVERY DAY    . levocetirizine (XYZAL) 5 MG tablet Take 5 mg by mouth every evening.    . metoprolol tartrate (LOPRESSOR) 25 MG tablet Take 1 tablet (25 mg total) by mouth 2 (two) times daily. 60 tablet 11   No facility-administered medications prior to visit.     Per HPI unless specifically indicated in ROS section below Review of Systems Objective:  BP 138/82   Pulse 86   Temp 97.6 F (36.4 C)   Ht 5' 8.5" (1.74 m)   Wt 289 lb (131.1 kg)   SpO2 98%   BMI 43.30 kg/m   Wt Readings from Last 3 Encounters:  07/02/19 289 lb (131.1 kg)  04/15/19 294 lb 8 oz (133.6 kg)  04/02/19 288 lb 11.2 oz (131 kg)      Physical Exam Vitals and nursing note reviewed.  Constitutional:      Appearance: Normal appearance. He is not ill-appearing.  Eyes:     General: Lids are normal. No allergic shiner.       Right eye: Discharge present. No foreign body.        Left eye: Discharge present.No foreign body.     Extraocular Movements: Extraocular movements intact.  Conjunctiva/sclera:     Right eye: Right conjunctiva is injected.     Left eye: Left conjunctiva is injected.     Pupils: Pupils are equal, round, and reactive to light.     Comments: Bulbar > palpebral conjunctival erythema/injection with limbic sparing  Cardiovascular:     Rate and Rhythm: Normal rate and regular rhythm.     Pulses: Normal pulses.     Heart sounds: Normal heart sounds. No murmur heard.   Pulmonary:     Effort: Pulmonary effort is normal. No respiratory distress.     Breath sounds: Normal breath sounds. No wheezing, rhonchi or rales.  Musculoskeletal:     Right lower leg: No edema.     Left lower leg: No edema.  Neurological:     Mental Status: He is alert.  Psychiatric:        Mood and Affect: Mood normal.        Behavior:  Behavior normal.       Lab Results  Component Value Date   TSH 3.75 03/12/2019    Assessment & Plan:  This visit occurred during the SARS-CoV-2 public health emergency.  Safety protocols were in place, including screening questions prior to the visit, additional usage of staff PPE, and extensive cleaning of exam room while observing appropriate contact time as indicated for disinfecting solutions.   Problem List Items Addressed This Visit    Perennial allergic rhinitis    Regularly on xyzal.       Palpitation    Ongoing, seem positional (only when supine).  Improved with metoprolol however given ongoing symptoms reasonable to get cardiology input. Pt agrees with plan.       Eosinophilia    Thought allergic - seeing heme.       Allergic conjunctivitis of both eyes - Primary    In h/o allergic rhinitis. He is allergic to trees and mold. He has been outdoors recently. Anticipate allergic conjunctivitis. Reviewed allergen avoidance measures. Treat with cool compresses and patanol antihistamine eye drops. Update if not improving with treatment.  Less likely viral conjunctivitis.           Meds ordered this encounter  Medications  . olopatadine (PATANOL) 0.1 % ophthalmic solution    Sig: Place 1 drop into both eyes 2 (two) times daily.    Dispense:  5 mL    Refill:  1   No orders of the defined types were placed in this encounter.   Patient instructions: Let's refer you to cardiology for ongoing racing heart feeling worse when laying down.  I think eye symptoms are more allergy related. Treat with cool compresses as well as patanol antihistamine eye drops sent to pharmacy.   Follow up plan: Return if symptoms worsen or fail to improve.  Eustaquio Boyden, MD

## 2019-07-08 ENCOUNTER — Other Ambulatory Visit: Payer: Self-pay

## 2019-07-08 ENCOUNTER — Ambulatory Visit (INDEPENDENT_AMBULATORY_CARE_PROVIDER_SITE_OTHER): Payer: 59 | Admitting: Internal Medicine

## 2019-07-08 ENCOUNTER — Encounter: Payer: Self-pay | Admitting: Internal Medicine

## 2019-07-08 VITALS — BP 134/80 | HR 75 | Ht 68.5 in | Wt 289.2 lb

## 2019-07-08 DIAGNOSIS — E782 Mixed hyperlipidemia: Secondary | ICD-10-CM

## 2019-07-08 DIAGNOSIS — R002 Palpitations: Secondary | ICD-10-CM | POA: Diagnosis not present

## 2019-07-08 DIAGNOSIS — R0789 Other chest pain: Secondary | ICD-10-CM

## 2019-07-08 DIAGNOSIS — Z8279 Family history of other congenital malformations, deformations and chromosomal abnormalities: Secondary | ICD-10-CM

## 2019-07-08 DIAGNOSIS — I10 Essential (primary) hypertension: Secondary | ICD-10-CM | POA: Diagnosis not present

## 2019-07-08 NOTE — Progress Notes (Signed)
New Outpatient Visit Date: 07/08/2019  Referring Provider: Ria Bush, MD [address] [place],  [place] [address]  Chief Complaint: Palpitations  HPI:  Micheal [name] is a 29 y.o. male who is being seen today for the evaluation of palpitations at the request of Dr. Danise Mina. He has a history of hyperlipidemia, obstructive sleep apnea on CPAP, psoriasis and childhood asthma.  Beginning in February, Mr. Klecka began to notice elevated heart rates near 100 bpm.  The first episode woke him up from sleep.  He checked his BP and found it to be mildly elevated.  He was started on low-dose metoprolol by Dr. Danise Mina with improvement in his palpitations and blood pressure.  However, he had two further episodes of palpitations when lying in bed last week.  He states that his heart speeds up and seems to be pumping harder than usual.  The symptoms typically subside without intervention over the course of a few minutes.  Mr. Mcglasson has found that sitting upright or even sitting in a recliner seems to help the symptoms.  He has a vague chest discomfort associated with the palpitations but otherwise denies chest pain, shortness of breath, lightheadedness, and edema.  He is now able to lie flat without difficulty.  Mr. Wingrove denies a history of heart disease and testing but is concerned about potential cardiac problems, given his father's history of a bicuspid aortic valve.  Mr. Yannuzzi was also recently found to have elevated eosinophil levels (suspected to be due to his allergies) and is undergoing workup by Dr. Grayland Ormond (hematology.  --------------------------------------------------------------------------------------------------  Cardiovascular History & Procedures: Cardiovascular Problems:  Palpitations  Risk Factors:  Hyperlipidemia, male gender, and morbid obesity  Cath/PCI:  None  CV Surgery:  None  EP Procedures and Devices:  None  Non-Invasive  Evaluation(s):  None  Recent CV Pertinent Labs: Lab Results  Component Value Date   CHOL 197 04/02/2019   HDL 36 (L) 04/02/2019   LDLCALC 125 (H) 04/02/2019   LDLDIRECT 151.0 11/09/2015   TRIG 225 (H) 04/02/2019   CHOLHDL 5.5 (H) 04/02/2019   K 4.6 04/02/2019   BUN 13 04/02/2019   CREATININE 0.98 04/02/2019    --------------------------------------------------------------------------------------------------  Past Medical History:  Diagnosis Date  . Childhood asthma   . Perennial allergic rhinitis   . Psoriasis     Past Surgical History:  Procedure Laterality Date  . WISDOM TOOTH EXTRACTION      Current Meds  Medication Sig  . augmented betamethasone dipropionate (DIPROLENE-AF) 0.05 % cream APPLY TO AFFECTED AREA ON ELBOWS TWICE A DAY UNTIL CLEAR, THEN USE AS NEEDED  . calcipotriene (DOVONOX) 0.005 % cream APPLY ON THE SKIN AS DIRECTED TWICE DAILY TO FACE AND FOLD OF SKIN UNTIL CLEAR THEN AS NEEDED  . famotidine (PEPCID) 20 MG tablet Take 1 tablet (20 mg total) by mouth 2 (two) times daily. (Patient taking differently: Take 20 mg by mouth daily. )  . ketoconazole (NIZORAL) 2 % shampoo APPLY TO AFFECTED AREA EVERY DAY  . levocetirizine (XYZAL) 5 MG tablet Take 5 mg by mouth every evening.  . metoprolol tartrate (LOPRESSOR) 25 MG tablet Take 1 tablet (25 mg total) by mouth 2 (two) times daily.  [name] olopatadine (PATANOL) 0.1 % ophthalmic solution Place 1 drop into both eyes 2 (two) times daily.    Allergies: Patient has no known allergies.  Social History   Tobacco Use  . Smoking status: Never Smoker  . Smokeless tobacco: Never Used  Vaping Use  .  Vaping Use: Never used  Substance Use Topics  . Alcohol use: No  . Drug use: No    Family History  Problem Relation Age of Onset  . Valvular heart disease Father        bicuspid aortic valve s/p replacement  . Cancer Maternal Grandfather        pancreatic  . Diabetes Paternal Grandmother   . Hypertension Paternal  Grandmother   . CAD Neg Hx   . Stroke Neg Hx     Review of Systems: A 12-system review of systems was performed and was negative except as noted in the HPI.  --------------------------------------------------------------------------------------------------. Physical Exam: BP 134/80 (BP Location: Right Arm, Patient Position: Sitting, Cuff Size: Large)   Pulse 75   Ht 5' 8.5" (1.74 m)   Wt 289 lb 4 oz (131.2 kg)   SpO2 98%   BMI 43.34 kg/m   General:  NAD> HEENT: No conjunctival pallor or scleral icterus. Facemask in place. Neck: Supple without lymphadenopathy, thyromegaly, JVD, or HJR, though body habitus limits evaluation. No carotid bruit. Lungs: Normal work of breathing. Clear to auscultation bilaterally without wheezes or crackles. Heart: Regular rate and rhythm without murmurs, rubs, or gallops. Non-displaced PMI. Abd: Bowel sounds present. Soft, NT/ND without hepatosplenomegaly Ext: No lower extremity edema. Radial, PT, and DP pulses are 2+ bilaterally Skin: Warm and dry without rash. Neuro: CNIII-XII intact. Strength and fine-touch sensation intact in upper and lower extremities bilaterally. Psych: Normal mood and affect.  EKG:  Normal sinus rhythm without abnormality.  Lab Results  Component Value Date   WBC 10.4 04/15/2019   HGB 15.4 04/15/2019   HCT 46.1 04/15/2019   MCV 84.6 04/15/2019   PLT 265 04/15/2019    Lab Results  Component Value Date   NA 137 04/02/2019   K 4.6 04/02/2019   CL 100 04/02/2019   CO2 25 04/02/2019   BUN 13 04/02/2019   CREATININE 0.98 04/02/2019   GLUCOSE 84 04/02/2019   ALT 45 04/02/2019    Lab Results  Component Value Date   CHOL 197 04/02/2019   HDL 36 (L) 04/02/2019   LDLCALC 125 (H) 04/02/2019   LDLDIRECT 151.0 11/09/2015   TRIG 225 (H) 04/02/2019   CHOLHDL 5.5 (H) 04/02/2019   Lab Results  Component Value Date   TSH 3.75 03/12/2019     --------------------------------------------------------------------------------------------------  ASSESSMENT AND PLAN: Palpitations and chest discomfort: Symptoms began in 02/2019 and have improved with metoprolol (though he has experienced two episodes within the last week).  Examination and EKG today are unrevealing.  Given his father's history of bicuspid aortic valve, Mr. Lukes is at increased risk for structural heart disease though could lead to palpitations.  We have therefore agreed to obtain a transthoracic echocardiogram.  We also discussed ambulatory cardiac monitoring; Mr. Kostelnik plans to purchase an Apple Watch capable of recording a single lead rhythm strip and would like to use this in lieu of a ZIO patch.  I think this is reasonable.  We will continue his current dose of metoprolol tartarte 25 mg BID.  Labs within the last few months were unrevealing other than elevated eosinophil levels being evaluated by hematology.  Hypertension: Blood pressure upper normal today.  Continue current dose of metoprolol and work on lifestyle modifications, including sodium restriction and weight loss.  Hyperlipidemia: Mildly elevated LDL and triglycerides noted on most recent lipid panel.  Mr. Kliethermes should continue to work on lifestyle modifications.  No indication for addition of statin therapy at  this time.  Morbid obesity: BMI > 40; weight loss encouraged through diet and exercise.  Yvonne Kendall, MD 07/08/2019 9:17 AM

## 2019-07-08 NOTE — Patient Instructions (Signed)
Medication Instructions:  Your physician recommends that you continue on your current medications as directed. Please refer to the Current Medication list given to you today.  *If you need a refill on your cardiac medications before your next appointment, please call your pharmacy*   Lab Work: none If you have labs (blood work) drawn today and your tests are completely normal, you will receive your results only by: . MyChart Message (if you have MyChart) OR . A paper copy in the mail If you have any lab test that is abnormal or we need to change your treatment, we will call you to review the results.   Testing/Procedures: Your physician has requested that you have an echocardiogram. Echocardiography is a painless test that uses sound waves to create images of your heart. It provides your doctor with information about the size and shape of your heart and how well your heart's chambers and valves are working. This procedure takes approximately one hour. There are no restrictions for this procedure. You may get an IV, if needed, to receive an ultrasound enhancing agent through to better visualize your heart.   Follow-Up: At [facility], you and your health needs are our priority.  As part of our continuing mission to provide you with exceptional heart care, we have created designated Provider Care Teams.  These Care Teams include your primary Cardiologist (physician) and Advanced Practice Providers (APPs -  Physician Assistants and Nurse Practitioners) who all work together to provide you with the care you need, when you need it.  We recommend signing up for the patient portal called "MyChart".  Sign up information is provided on this After Visit Summary.  MyChart is used to connect with patients for Virtual Visits (Telemedicine).  Patients are able to view lab/test results, encounter notes, upcoming appointments, etc.  Non-urgent messages can be sent to your provider as well.   To learn more  about what you can do with MyChart, go to [email or web address].    Your next appointment:   2 month(s)  The format for your next appointment:   In Person  Provider:    You may see DR CHRISTOPHER END or one of the following Advanced Practice Providers on your designated Care Team:    Christopher Berge, NP  Ryan Dunn, PA-C  Jacquelyn Visser, PA-C    Echocardiogram An echocardiogram is a procedure that uses painless sound waves (ultrasound) to produce an image of the heart. Images from an echocardiogram can provide important information about:  Signs of coronary artery disease (CAD).  Aneurysm detection. An aneurysm is a weak or damaged part of an artery wall that bulges out from the normal force of blood pumping through the body.  Heart size and shape. Changes in the size or shape of the heart can be associated with certain conditions, including heart failure, aneurysm, and CAD.  Heart muscle function.  Heart valve function.  Signs of a past heart attack.  Fluid buildup around the heart.  Thickening of the heart muscle.  A tumor or infectious growth around the heart valves. Tell a health care provider about:  Any allergies you have.  All medicines you are taking, including vitamins, herbs, eye drops, creams, and over-the-counter medicines.  Any blood disorders you have.  Any surgeries you have had.  Any medical conditions you have.  Whether you are pregnant or may be pregnant. What are the risks? Generally, this is a safe procedure. However, problems may occur, including:  Allergic reaction to dye (contrast)   that may be used during the procedure. What happens before the procedure? No specific preparation is needed. You may eat and drink normally. What happens during the procedure?   An IV tube may be inserted into one of your veins.  You may receive contrast through this tube. A contrast is an injection that improves the quality of the pictures from  your heart.  A gel will be applied to your chest.  A wand-like tool (transducer) will be moved over your chest. The gel will help to transmit the sound waves from the transducer.  The sound waves will harmlessly bounce off of your heart to allow the heart images to be captured in real-time motion. The images will be recorded on a computer. The procedure may vary among health care providers and hospitals. What happens after the procedure?  You may return to your normal, everyday life, including diet, activities, and medicines, unless your health care provider tells you not to do that. Summary  An echocardiogram is a procedure that uses painless sound waves (ultrasound) to produce an image of the heart.  Images from an echocardiogram can provide important information about the size and shape of your heart, heart muscle function, heart valve function, and fluid buildup around your heart.  You do not need to do anything to prepare before this procedure. You may eat and drink normally.  After the echocardiogram is completed, you may return to your normal, everyday life, unless your health care provider tells you not to do that. This information is not intended to replace advice given to you by your health care provider. Make sure you discuss any questions you have with your health care provider. Document Revised: 04/23/2018 Document Reviewed: 02/03/2016 Elsevier Patient Education  2020 [employer] Inc.  

## 2019-07-29 ENCOUNTER — Other Ambulatory Visit: Payer: Self-pay

## 2019-07-29 ENCOUNTER — Inpatient Hospital Stay: Payer: 59 | Attending: Oncology

## 2019-07-29 DIAGNOSIS — G4733 Obstructive sleep apnea (adult) (pediatric): Secondary | ICD-10-CM | POA: Insufficient documentation

## 2019-07-29 DIAGNOSIS — Z79899 Other long term (current) drug therapy: Secondary | ICD-10-CM | POA: Insufficient documentation

## 2019-07-29 DIAGNOSIS — D721 Eosinophilia, unspecified: Secondary | ICD-10-CM | POA: Diagnosis not present

## 2019-07-29 DIAGNOSIS — D7219 Other eosinophilia: Secondary | ICD-10-CM

## 2019-07-31 NOTE — Progress Notes (Signed)
[facility]  Telephone:(336) [phone] Fax:(336) [phone]  ID: Micheal [name] OB: 11-Jul-1990  MR#: [id number]  KGU#:[id number]  Patient Care Team: Ria Bush, MD as PCP - General (Family Medicine)  I connected with Micheal [name] on 08/06/19 at  2:15 PM EDT by video enabled telemedicine visit and verified that I am speaking with the correct person using two identifiers.   I discussed the limitations, risks, security and privacy concerns of performing an evaluation and management service by telemedicine and the availability of in-person appointments. I also discussed with the patient that there may be a patient responsible charge related to this service. The patient expressed understanding and agreed to proceed.   Other persons participating in the visit and their role in the encounter: Patient, MD.  Patient's location: Home. Provider's location: Clinic.  CHIEF COMPLAINT: Eosinophilia.  INTERVAL HISTORY: Patient agreed to video assisted telemedicine visit for further evaluation and discussion of his laboratory results.  Due to technical difficulties, the appointment was transitioned to a telephone visit.  He currently feels well and is asymptomatic.  He has no neurologic complaints.  He denies any recent fevers or illnesses.  He has a good appetite and denies weight loss.  He has no night sweats.  He has no chest pain, shortness of breath, cough, or hemoptysis.  He denies any nausea, vomiting, constipation, or diarrhea.  He has no urinary complaints.  Patient offers no specific complaints today.   REVIEW OF SYSTEMS:   Review of Systems  Constitutional: Negative.  Negative for fever, malaise/fatigue and weight loss.  Respiratory: Negative.  Negative for cough and shortness of breath.   Cardiovascular: Negative.  Negative for chest pain and leg swelling.  Gastrointestinal: Negative.  Negative for abdominal pain, blood in stool and melena.  Genitourinary: Negative.   Negative for hematuria.  Musculoskeletal: Negative.  Negative for back pain.  Skin: Negative.  Negative for itching and rash.  Neurological: Negative.  Negative for dizziness, seizures, weakness and headaches.  Psychiatric/Behavioral: Negative.  The patient is not nervous/anxious.     As per HPI. Otherwise, a complete review of systems is negative.  PAST MEDICAL HISTORY: Past Medical History:  Diagnosis Date  . Childhood asthma   . Obstructive sleep apnea   . Perennial allergic rhinitis   . Psoriasis     PAST SURGICAL HISTORY: Past Surgical History:  Procedure Laterality Date  . WISDOM TOOTH EXTRACTION      FAMILY HISTORY: Family History  Problem Relation Age of Onset  . Valvular heart disease Father        bicuspid aortic valve s/p replacement  . Cancer Maternal Grandfather        pancreatic  . Diabetes Paternal Grandmother   . Hypertension Paternal Grandmother   . CAD Neg Hx   . Stroke Neg Hx     ADVANCED DIRECTIVES (Y/N):  N  HEALTH MAINTENANCE: Social History   Tobacco Use  . Smoking status: Never Smoker  . Smokeless tobacco: Never Used  Vaping Use  . Vaping Use: Never used  Substance Use Topics  . Alcohol use: No  . Drug use: No     Colonoscopy:  PAP:  Bone density:  Lipid panel:  No Known Allergies  Current Outpatient Medications  Medication Sig Dispense Refill  . augmented betamethasone dipropionate (DIPROLENE-AF) 0.05 % cream APPLY TO AFFECTED AREA ON ELBOWS TWICE A DAY UNTIL CLEAR, THEN USE AS NEEDED    . calcipotriene (DOVONOX) 0.005 % cream APPLY ON THE SKIN  AS DIRECTED TWICE DAILY TO FACE AND FOLD OF SKIN UNTIL CLEAR THEN AS NEEDED    . famotidine (PEPCID) 20 MG tablet Take 1 tablet (20 mg total) by mouth 2 (two) times daily. (Patient taking differently: Take 20 mg by mouth daily. ) 30 tablet 0  . ketoconazole (NIZORAL) 2 % shampoo APPLY TO AFFECTED AREA EVERY DAY    . levocetirizine (XYZAL) 5 MG tablet Take 5 mg by mouth every evening.      . metoprolol tartrate (LOPRESSOR) 25 MG tablet Take 1 tablet (25 mg total) by mouth 2 (two) times daily. 60 tablet 11  . olopatadine (PATANOL) 0.1 % ophthalmic solution Place 1 drop into both eyes 2 (two) times daily. 5 mL 1   No current facility-administered medications for this visit.    OBJECTIVE: There were no vitals filed for this visit.   There is no height or weight on file to calculate BMI.    ECOG FS:0 - Asymptomatic   General: Well-developed, well-nourished, no acute distress. HEENT: Normocephalic. Neuro: Alert, answering all questions appropriately. Cranial nerves grossly intact. Psych: Normal affect.   LAB RESULTS:  Lab Results  Component Value Date   NA 137 04/02/2019   K 4.6 04/02/2019   CL 100 04/02/2019   CO2 25 04/02/2019   GLUCOSE 84 04/02/2019   BUN 13 04/02/2019   CREATININE 0.98 04/02/2019   CALCIUM 10.3 04/02/2019   PROT 8.1 04/02/2019   PROT 8.2 (H) 04/02/2019   ALBUMIN 5.0 12/09/2017   AST 23 04/02/2019   ALT 45 04/02/2019   ALKPHOS 83 12/09/2017   BILITOT 0.5 04/02/2019   GFRNONAA >60 12/09/2017   GFRAA >60 12/09/2017    Lab Results  Component Value Date   WBC 10.4 04/15/2019   NEUTROABS 4.6 04/15/2019   HGB 15.4 04/15/2019   HCT 46.1 04/15/2019   MCV 84.6 04/15/2019   PLT 265 04/15/2019     STUDIES: No results found.  ASSESSMENT: Eosinophilia  PLAN:    1. Eosinophilia: Patient's most recent absolute eosinophil count was increased to 2.9 with a normal white blood cell count. Repeat Cbc was not drawn today. His eczema and seasonal allergies have resolved.  All of his other blood work including peripheral blood flow cytometry was either negative or within normal limits. PDGFRA, PDGFRB, and FGR1 to assess for hypereosinophilic syndrome are pending at time of dictation.   No intervention is needed at this time.  Patient does not require bone marrow biopsy. Patient will have video assisted telemedicine visit in 4 weeks with repeat CBC. If  his eosinophil count has normalized, he likely can be discharged from clinic.  I provided 20 minutes of face-to-face video visit time during this encounter which included chart review, counseling, and coordination of care as documented above.   Patient expressed understanding and was in agreement with this plan. He also understands that He can call clinic at any time with any questions, concerns, or complaints.    Lloyd Huger, MD   08/06/2019 8:05 AM

## 2019-08-03 ENCOUNTER — Inpatient Hospital Stay (HOSPITAL_BASED_OUTPATIENT_CLINIC_OR_DEPARTMENT_OTHER): Payer: 59 | Admitting: Oncology

## 2019-08-03 ENCOUNTER — Encounter: Payer: Self-pay | Admitting: Oncology

## 2019-08-03 DIAGNOSIS — D7219 Other eosinophilia: Secondary | ICD-10-CM | POA: Diagnosis not present

## 2019-08-03 NOTE — Progress Notes (Signed)
Pt called for scheduled my chart video visit today at 215 pm.  Pt denies any medication changes.  States had an episode of "fast heart rate last night but improved" and no difficulties today.  Pt verbalized understanding that MD would send text closer today 215 pm today for visit.

## 2019-08-05 LAB — MISC LABCORP TEST (SEND OUT): Labcorp test code: 511444

## 2019-08-17 ENCOUNTER — Other Ambulatory Visit: Payer: Self-pay

## 2019-08-17 ENCOUNTER — Ambulatory Visit (INDEPENDENT_AMBULATORY_CARE_PROVIDER_SITE_OTHER): Payer: 59

## 2019-08-17 DIAGNOSIS — Z8279 Family history of other congenital malformations, deformations and chromosomal abnormalities: Secondary | ICD-10-CM | POA: Diagnosis not present

## 2019-08-17 DIAGNOSIS — R002 Palpitations: Secondary | ICD-10-CM | POA: Diagnosis not present

## 2019-08-17 DIAGNOSIS — R0789 Other chest pain: Secondary | ICD-10-CM | POA: Diagnosis not present

## 2019-08-17 LAB — ECHOCARDIOGRAM COMPLETE
AR max vel: 2.58 cm2
AV Area VTI: 2.46 cm2
AV Area mean vel: 2.24 cm2
AV Mean grad: 8 mmHg
AV Peak grad: 14.7 mmHg
Ao pk vel: 1.92 m/s
Area-P 1/2: 4.21 cm2
S' Lateral: 3 cm

## 2019-08-17 MED ORDER — PERFLUTREN LIPID MICROSPHERE
1.0000 mL | INTRAVENOUS | Status: AC | PRN
  Administered 2019-08-17: 2 mL via INTRAVENOUS

## 2019-09-02 NOTE — Progress Notes (Signed)
  [facility]  Telephone:(336) [phone] Fax:(336) [phone]  ID: Zachery Dauer OB: 06/09/1990  MR#: [id number]  QMK#:[id number]  Patient Care Team: Eustaquio Boyden, MD as PCP - General (Family Medicine)  I Jeralyn Ruths, MD   09/08/2019 8:41 AM     This encounter was created in error - please disregard.

## 2019-09-03 ENCOUNTER — Other Ambulatory Visit: Payer: Self-pay

## 2019-09-03 ENCOUNTER — Inpatient Hospital Stay: Payer: 59 | Attending: Oncology

## 2019-09-03 DIAGNOSIS — D721 Eosinophilia, unspecified: Secondary | ICD-10-CM | POA: Insufficient documentation

## 2019-09-03 DIAGNOSIS — G4733 Obstructive sleep apnea (adult) (pediatric): Secondary | ICD-10-CM | POA: Diagnosis not present

## 2019-09-03 DIAGNOSIS — D7219 Other eosinophilia: Secondary | ICD-10-CM

## 2019-09-03 DIAGNOSIS — Z79899 Other long term (current) drug therapy: Secondary | ICD-10-CM | POA: Insufficient documentation

## 2019-09-03 LAB — CBC WITH DIFFERENTIAL/PLATELET
Abs Immature Granulocytes: 0.03 10*3/uL (ref 0.00–0.07)
Basophils Absolute: 0.1 10*3/uL (ref 0.0–0.1)
Basophils Relative: 1 %
Eosinophils Absolute: 2.9 10*3/uL — ABNORMAL HIGH (ref 0.0–0.5)
Eosinophils Relative: 31 %
HCT: 41.2 % (ref 39.0–52.0)
Hemoglobin: 14.2 g/dL (ref 13.0–17.0)
Immature Granulocytes: 0 %
Lymphocytes Relative: 20 %
Lymphs Abs: 1.9 10*3/uL (ref 0.7–4.0)
MCH: 28.8 pg (ref 26.0–34.0)
MCHC: 34.5 g/dL (ref 30.0–36.0)
MCV: 83.6 fL (ref 80.0–100.0)
Monocytes Absolute: 0.5 10*3/uL (ref 0.1–1.0)
Monocytes Relative: 6 %
Neutro Abs: 3.9 10*3/uL (ref 1.7–7.7)
Neutrophils Relative %: 42 %
Platelets: 249 10*3/uL (ref 150–400)
RBC: 4.93 MIL/uL (ref 4.22–5.81)
RDW: 12.4 % (ref 11.5–15.5)
WBC: 9.3 10*3/uL (ref 4.0–10.5)
nRBC: 0 % (ref 0.0–0.2)

## 2019-09-07 ENCOUNTER — Inpatient Hospital Stay (HOSPITAL_BASED_OUTPATIENT_CLINIC_OR_DEPARTMENT_OTHER): Payer: 59 | Admitting: Oncology

## 2019-09-07 ENCOUNTER — Telehealth: Payer: Self-pay

## 2019-09-07 ENCOUNTER — Other Ambulatory Visit: Payer: Self-pay

## 2019-09-07 ENCOUNTER — Inpatient Hospital Stay: Payer: 59 | Admitting: Oncology

## 2019-09-07 ENCOUNTER — Encounter: Payer: Self-pay | Admitting: Oncology

## 2019-09-07 DIAGNOSIS — D7219 Other eosinophilia: Secondary | ICD-10-CM

## 2019-09-07 NOTE — Progress Notes (Signed)
[place]  [phone] [phone]  ID: Micheal [name]  MR#: [id number]  ESP#:[id number]  Patient Care Team: Ria Bush, MD as PCP - General (Family Medicine)  I connected with Micheal [name] on 09/07/19 at  2:00 PM EDT by video enabled telemedicine visit and verified that I am speaking with the correct person using two identifiers.   I discussed the limitations, risks, security and privacy concerns of performing an evaluation and management service by telemedicine and the availability of in-person appointments. I also discussed with the patient that there may be a patient responsible charge related to this service. The patient expressed understanding and agreed to proceed.   Other persons participating in the visit and their role in the encounter: Patient, MD.  Patient's location: Home. Provider's location: Home.  CHIEF COMPLAINT: Eosinophilia.  INTERVAL HISTORY: Patient agreed to video assisted telemedicine visit for further evaluation and discussion of his laboratory results.  He continues to feel well and remains asymptomatic.  He has had no problems with eczema or seasonal allergies recently. He has no neurologic complaints.  He denies any recent fevers or illnesses.  He has a good appetite and denies weight loss.  He has no night sweats.  He has no chest pain, shortness of breath, cough, or hemoptysis.  He denies any nausea, vomiting, constipation, or diarrhea.  He has no urinary complaints.  Patient feels at his baseline and offers no specific complaints today.  REVIEW OF SYSTEMS:   Review of Systems  Constitutional: Negative.  Negative for fever, malaise/fatigue and weight loss.  Respiratory: Negative.  Negative for cough and shortness of breath.   Cardiovascular: Negative.  Negative for chest pain and leg swelling.  Gastrointestinal: Negative.  Negative for abdominal pain, blood in stool and melena.  Genitourinary:  Negative.  Negative for hematuria.  Musculoskeletal: Negative.  Negative for back pain.  Skin: Negative.  Negative for itching and rash.  Neurological: Negative.  Negative for dizziness, seizures, weakness and headaches.  Psychiatric/Behavioral: Negative.  The patient is not nervous/anxious.     As per HPI. Otherwise, a complete review of systems is negative.  PAST MEDICAL HISTORY: Past Medical History:  Diagnosis Date  . Childhood asthma   . Obstructive sleep apnea   . Perennial allergic rhinitis   . Psoriasis     PAST SURGICAL HISTORY: Past Surgical History:  Procedure Laterality Date  . WISDOM TOOTH EXTRACTION      FAMILY HISTORY: Family History  Problem Relation Age of Onset  . Valvular heart disease Father        bicuspid aortic valve s/p replacement  . Cancer Maternal Grandfather        pancreatic  . Diabetes Paternal Grandmother   . Hypertension Paternal Grandmother   . CAD Neg Hx   . Stroke Neg Hx     ADVANCED DIRECTIVES (Y/N):  N  HEALTH MAINTENANCE: Social History   Tobacco Use  . Smoking status: Never Smoker  . Smokeless tobacco: Never Used  Vaping Use  . Vaping Use: Never used  Substance Use Topics  . Alcohol use: No  . Drug use: No     Colonoscopy:  PAP:  Bone density:  Lipid panel:  No Known Allergies  Current Outpatient Medications  Medication Sig Dispense Refill  . augmented betamethasone dipropionate (DIPROLENE-AF) 0.05 % cream APPLY TO AFFECTED AREA ON ELBOWS TWICE A DAY UNTIL CLEAR, THEN USE AS NEEDED    . calcipotriene (DOVONOX) 0.005 % cream APPLY  ON THE SKIN AS DIRECTED TWICE DAILY TO FACE AND FOLD OF SKIN UNTIL CLEAR THEN AS NEEDED    . famotidine (PEPCID) 20 MG tablet Take 1 tablet (20 mg total) by mouth 2 (two) times daily. (Patient taking differently: Take 20 mg by mouth daily. ) 30 tablet 0  . ketoconazole (NIZORAL) 2 % shampoo APPLY TO AFFECTED AREA EVERY DAY    . levocetirizine (XYZAL) 5 MG tablet Take 5 mg by mouth every  evening.    . metoprolol tartrate (LOPRESSOR) 25 MG tablet Take 1 tablet (25 mg total) by mouth 2 (two) times daily. 60 tablet 11  . olopatadine (PATANOL) 0.1 % ophthalmic solution Place 1 drop into both eyes 2 (two) times daily. (Patient not taking: Reported on 09/07/2019) 5 mL 1   No current facility-administered medications for this visit.    OBJECTIVE: There were no vitals filed for this visit.   There is no height or weight on file to calculate BMI.    ECOG FS:0 - Asymptomatic   General: Well-developed, well-nourished, no acute distress. HEENT: Normocephalic. Neuro: Alert, answering all questions appropriately. Cranial nerves grossly intact. Psych: Normal affect.   LAB RESULTS:  Lab Results  Component Value Date   NA 137 04/02/2019   K 4.6 04/02/2019   CL 100 04/02/2019   CO2 25 04/02/2019   GLUCOSE 84 04/02/2019   BUN 13 04/02/2019   CREATININE 0.98 04/02/2019   CALCIUM 10.3 04/02/2019   PROT 8.1 04/02/2019   PROT 8.2 (H) 04/02/2019   ALBUMIN 5.0 12/09/2017   AST 23 04/02/2019   ALT 45 04/02/2019   ALKPHOS 83 12/09/2017   BILITOT 0.5 04/02/2019   GFRNONAA >60 12/09/2017   GFRAA >60 12/09/2017    Lab Results  Component Value Date   WBC 9.3 09/03/2019   NEUTROABS 3.9 09/03/2019   HGB 14.2 09/03/2019   HCT 41.2 09/03/2019   MCV 83.6 09/03/2019   PLT 249 09/03/2019     STUDIES: ECHOCARDIOGRAM COMPLETE  Result Date: 08/17/2019    ECHOCARDIOGRAM REPORT   Patient Name:   Micheal [name] Date of Exam: 08/17/2019 Medical Rec #:  [id number]      Height:       68.5 in Accession #:    [id number]     Weight:       289.2 lb Date of Birth:  02-26-1990      BSA:          2.404 m Patient Age:    29 years       BP:           134/80 mmHg Patient Gender: M              HR:           96 bpm. Exam Location:  [place] Procedure: 2D Echo, Cardiac Doppler, Color Doppler and Intracardiac            Opacification Agent Indications:    R07.9* Chest pain, unspecified  History:         Patient has no prior history of Echocardiogram examinations.                 Family h/o bicuspid AV, Signs/Symptoms:Chest Pain; Risk                 Factors:Hypertension, Non-Smoker, Sleep Apnea and Dyslipidemia.                 GERD.  Sonographer:    Pilar Jarvis RDMS,  RVT, RDCS Referring Phys: [place]  1. Left ventricular ejection fraction, by estimation, is 60 to 65%. The left ventricle has normal function. The left ventricle has no regional wall motion abnormalities. Left ventricular diastolic parameters were normal.  2. Right ventricular systolic function is normal. The right ventricular size is normal. Tricuspid regurgitation signal is inadequate for assessing PA pressure.  3. The mitral valve is normal in structure. No evidence of mitral valve regurgitation. No evidence of mitral stenosis.  4. The aortic valve is tricuspid. Aortic valve regurgitation is not visualized. No aortic stenosis is present.  5. The inferior vena cava is normal in size with greater than 50% respiratory variability, suggesting right atrial pressure of 3 mmHg. FINDINGS  Left Ventricle: Left ventricular ejection fraction, by estimation, is 60 to 65%. The left ventricle has normal function. The left ventricle has no regional wall motion abnormalities. Definity contrast agent was given IV to delineate the left ventricular  endocardial borders. The left ventricular internal cavity size was normal in size. There is no left ventricular hypertrophy. Left ventricular diastolic parameters were normal. Right Ventricle: The right ventricular size is normal. No increase in right ventricular wall thickness. Right ventricular systolic function is normal. Tricuspid regurgitation signal is inadequate for assessing PA pressure. Left Atrium: Left atrial size was normal in size. Right Atrium: Right atrial size was normal in size. Pericardium: There is no evidence of pericardial effusion. Mitral Valve: The mitral valve is normal in  structure. No evidence of mitral valve regurgitation. No evidence of mitral valve stenosis. Tricuspid Valve: The tricuspid valve is grossly normal. Tricuspid valve regurgitation is not demonstrated. Aortic Valve: The aortic valve is tricuspid. Aortic valve regurgitation is not visualized. No aortic stenosis is present. Aortic valve mean gradient measures 8.0 mmHg. Aortic valve peak gradient measures 14.7 mmHg. Aortic valve area, by VTI measures 2.46  cm. Pulmonic Valve: The pulmonic valve was not well visualized. Pulmonic valve regurgitation is not visualized. No evidence of pulmonic stenosis. Aorta: The aortic root and ascending aorta are structurally normal, with no evidence of dilitation. Pulmonary Artery: The pulmonary artery is of normal size. Venous: The inferior vena cava is normal in size with greater than 50% respiratory variability, suggesting right atrial pressure of 3 mmHg. IAS/Shunts: The interatrial septum was not well visualized.  LEFT VENTRICLE PLAX 2D LVIDd:         4.80 cm  Diastology LVIDs:         3.00 cm  LV e' lateral:   18.10 cm/s LV PW:         1.00 cm  LV E/e' lateral: 7.5 LV IVS:        0.90 cm  LV e' medial:    10.30 cm/s LVOT diam:     2.10 cm  LV E/e' medial:  13.2 LV SV:         88 LV SV Index:   37 LVOT Area:     3.46 cm  RIGHT VENTRICLE             IVC RV Basal diam:  3.30 cm     IVC diam: 2.00 cm RV S prime:     16.90 cm/s TAPSE (M-mode): 3.2 cm LEFT ATRIUM             Index       RIGHT ATRIUM           Index LA diam:        3.40 cm 1.41 cm/m  RA Area:     10.30 cm LA Vol (A2C):   49.3 ml 20.51 ml/m RA Volume:   21.60 ml  8.99 ml/m LA Vol (A4C):   39.0 ml 16.22 ml/m LA Biplane Vol: 47.7 ml 19.84 ml/m  AORTIC VALVE                    PULMONIC VALVE AV Area (Vmax):    2.58 cm     PV Vmax:       1.08 m/s AV Area (Vmean):   2.24 cm     PV Peak grad:  4.7 mmHg AV Area (VTI):     2.46 cm AV Vmax:           192.00 cm/s AV Vmean:          135.000 cm/s AV VTI:            0.358 m AV  Peak Grad:      14.7 mmHg AV Mean Grad:      8.0 mmHg LVOT Vmax:         143.00 cm/s LVOT Vmean:        87.300 cm/s LVOT VTI:          0.254 m LVOT/AV VTI ratio: 0.71  AORTA Ao Root diam: 3.50 cm Ao Asc diam:  3.20 cm MITRAL VALVE MV Area (PHT): 4.21 cm     SHUNTS MV Decel Time: 180 msec     Systemic VTI:  0.25 m MV E velocity: 136.00 cm/s  Systemic Diam: 2.10 cm MV A velocity: 66.70 cm/s MV E/A ratio:  2.04 Harrell Gave End MD Electronically signed by Nelva Bush MD Signature Date/Time: 08/17/2019/5:09:24 PM    Final     ASSESSMENT: Eosinophilia  PLAN:    1. Eosinophilia: Patient's white blood cell count remains within normal limits, but there continues to be a persistent yet unchanged absolute eosinophilia of 2.9.  All of his other blood work including peripheral blood flow cytometry,  PDGFRA, PDGFRB, and FGR1 are all either negative or within normal limits.  No intervention is needed at this time.  Patient does not require bone marrow biopsy.  After lengthy discussion, it was agreed upon that no further follow-up is necessary.  Recommend monitoring CBC 1 or 2 times per year and if there are any concerns or patient becomes symptomatic please refer him back for further evaluation.     I provided 20 minutes of face-to-face video visit time during this encounter which included chart review, counseling, and coordination of care as documented above.   Patient expressed understanding and was in agreement with this plan. He also understands that He can call clinic at any time with any questions, concerns, or complaints.    Lloyd Huger, MD   09/07/2019 3:38 PM

## 2019-09-07 NOTE — Progress Notes (Signed)
Patient called, pre- screened for virtual appoinment today with oncologist. Concerns of increased heart rate and papillations which he is being followed by PCP per patient. Patient states he can go ahead with the appointment today but has a meeting at 230 and "hopefully the appointment doesnt go over".

## 2019-09-08 ENCOUNTER — Telehealth: Payer: Self-pay | Admitting: Internal Medicine

## 2019-09-08 ENCOUNTER — Ambulatory Visit: Payer: 59 | Admitting: Nurse Practitioner

## 2019-10-06 ENCOUNTER — Telehealth (INDEPENDENT_AMBULATORY_CARE_PROVIDER_SITE_OTHER): Payer: 59 | Admitting: Nurse Practitioner

## 2019-10-06 ENCOUNTER — Other Ambulatory Visit: Payer: Self-pay

## 2019-10-06 ENCOUNTER — Encounter: Payer: Self-pay | Admitting: Nurse Practitioner

## 2019-10-06 VITALS — HR 64 | Ht 68.5 in | Wt 286.0 lb

## 2019-10-06 DIAGNOSIS — I10 Essential (primary) hypertension: Secondary | ICD-10-CM

## 2019-10-06 DIAGNOSIS — K219 Gastro-esophageal reflux disease without esophagitis: Secondary | ICD-10-CM

## 2019-10-06 DIAGNOSIS — R002 Palpitations: Secondary | ICD-10-CM | POA: Diagnosis not present

## 2019-10-06 NOTE — Progress Notes (Signed)
Virtual Visit via Video Note   This visit type was conducted due to national recommendations for restrictions regarding the COVID-19 Pandemic (e.g. social distancing) in an effort to limit this patient's exposure and mitigate transmission in our community.  Due to his co-morbid illnesses, this patient is at least at moderate risk for complications without adequate follow up.  This format is felt to be most appropriate for this patient at this time.  All issues noted in this document were discussed and addressed.  A limited physical exam was performed with this format.  Please refer to the patient's chart for his consent to telehealth for [facility].    Evaluation Performed:  Follow-up visit  This visit type was conducted due to national recommendations for restrictions regarding the COVID-19 Pandemic (e.g. social distancing).  This format is felt to be most appropriate for this patient at this time.  All issues noted in this document were discussed and addressed.  No physical exam was performed (except for noted visual exam findings with Video Visits).  Please refer to the patient's chart (MyChart message for video visits and phone note for telephone visits) for the patient's consent to telehealth for [facility]. _____________   Date:  10/06/2019   Patient ID:  Micheal [name], DOB [birth year], MRN [id number] Patient Location:  Home Provider location:   Office  Primary Care Provider:  Eustaquio Boyden, MD Primary Cardiologist:  Yvonne Kendall, MD  Chief Complaint    29 year old male with a history of palpitations, hyperlipidemia, sleep apnea on CPAP, psoriasis, childhood asthma, allergies, and eosinophilia, who presents for follow-up related to palpitations.  Past Medical History    Past Medical History:  Diagnosis Date  . Childhood asthma   . Obstructive sleep apnea   . Palpitations    a. 08/2019 Echo: EF 60-65%, no rwma.   . Perennial allergic rhinitis   . Psoriasis     Past Surgical History:  Procedure Laterality Date  . WISDOM TOOTH EXTRACTION      Allergies  No Known Allergies  History of Present Illness    Micheal [name] is a 29 y.o. male who presents via audio/video conferencing for a telehealth visit today.  As above, he has a history of hyperlipidemia, sleep apnea on CPAP, psoriasis, childhood asthma, allergies, and eosinophilia (seen by hematology-no intervention required at this time).  He was seen in cardiology clinic back in June in the setting of a several month history of intermittent elevation in heart rates that initially improved with the addition of metoprolol therapy however within a week prior to his June visit, he had 2 episodes of sudden onset of palpitations, lasting a few minutes, and subsiding without intervention.  Symptoms typically occur while lying in bed and seem to improve even with just sitting up.  An echocardiogram was performed and showed normal LV function without regional wall motion abnormalities or any significant valvular abnormalities.  He was offered a Zio monitoring but had plan to purchase an apple watch to track his heart rate and rhythm instead.  Since his last visit, he has had only a few episodes of elevations in heart rates.  As previously noted, these typically occur while he is lying down in bed at night with his CPAP on already, preparing to fall asleep.  He will sometimes feel something in his chest and wonders if it is indigestion and then he begins worrying about it and as that is when he sees elevations in his heart rates.  He will often sit up and put on his apple watch and document a rhythm strip which is labeled sinus rhythm with rates in the 80s.  He was going to try and upload the strips to my chart.  He does not experience chest pain, dyspnea, PND, orthopnea, dizziness, syncope, edema, or early satiety.  He does not experience palpitations or reflux symptoms during the day.  The patient does not have  symptoms concerning for COVID-19 infection (fever, chills, cough, or new shortness of breath).   Home Medications    Prior to Admission medications   Medication Sig Start Date End Date Taking? Authorizing Provider  augmented betamethasone dipropionate (DIPROLENE-AF) 0.05 % cream APPLY TO AFFECTED AREA ON ELBOWS TWICE A DAY UNTIL CLEAR, THEN USE AS NEEDED 09/28/18   [provider]  calcipotriene (DOVONOX) 0.005 % cream APPLY ON THE SKIN AS DIRECTED TWICE DAILY TO FACE AND FOLD OF SKIN UNTIL CLEAR THEN AS NEEDED 10/07/18   [provider]  famotidine (PEPCID) 20 MG tablet Take 1 tablet (20 mg total) by mouth 2 (two) times daily. Patient taking differently: Take 20 mg by mouth daily.  12/09/17   Henderly, Britni A, PA-C  ketoconazole (NIZORAL) 2 % shampoo APPLY TO AFFECTED AREA EVERY DAY 01/27/19   [provider]  levocetirizine (XYZAL) 5 MG tablet Take 5 mg by mouth every evening.    [provider]  metoprolol tartrate (LOPRESSOR) 25 MG tablet Take 1 tablet (25 mg total) by mouth 2 (two) times daily. 04/02/19   Eustaquio Boyden, MD  olopatadine (PATANOL) 0.1 % ophthalmic solution Place 1 drop into both eyes 2 (two) times daily. Patient not taking: Reported on 09/07/2019 07/02/19   Eustaquio Boyden, MD    Review of Systems    Less frequent rises in heart rate noted while lying in bed at night-overall stable.  He denies chest pain, dyspnea, PND, orthopnea, dizziness, syncope, edema, or early satiety.  All other systems reviewed and are otherwise negative except as noted above.  Physical Exam    Vital Signs:  Pulse 64   Ht 5' 8.5" (1.74 m)   Wt 286 lb (129.7 kg)   SpO2 98%   BMI 42.85 kg/m    Well nourished, well developed male in no acute distress.  Speech is unlabored.  Respirations are regular.  Accessory Clinical Findings    None  Assessment & Plan    1.  Palpitations: Patient has a several month history of noticing slight rises in heart rate  when lying in bed at night.  Typically this occurs once his CPAP is already on and he is trying to fall asleep.  He does not think that he is experiencing apneic episodes followed by awakening, especially in the setting of CPAP use.  He believes that symptoms are more likely to occur after eating too close to bedtime, at which time he may experience frequent belching or GERD-like symptoms which then causes concern related to chest discomfort followed by rises in heart rate.  He has purchased an apple watch and has documented sinus rhythm in the 80s on 2 separate occasions when this has occurred.  He does not experience symptoms during the day.  He remains on beta-blocker therapy.  Overall, symptoms are fairly limited, occurring just a few times since his last visit in June.  Reassurance offered.  2. GERD: Patient now notes that rise in heart rates are typically occurring in the setting what he believes are GERD-like symptoms such as frequent  belching and mild indigestion that is more likely to occur if he is eating too close to bedtime.  He does not experience symptoms when he is sitting or standing.  We discussed paying careful attention to what he is eating prior to bed and at what time he eats it, to identify potential triggers.  He identifies that for sure, he notices a difference when eating a lot prior to bed and he has tried to make his meals smaller if he has to eat right before bedtime.  He is currently on Pepcid therapy.  3.  Essential hypertension: Remains on beta-blocker therapy.  No blood pressure reported today.  4.  Disposition: Patient will continue to track his heart rates on his apple watch.  He plans to turn on the irregular heart rhythm alert as we discussed an absence of alerts might go a long way in providing reassurance of the benign nature of his slight rises in heart rate.  We again discussed the availability of a Zio monitor for more complete evaluation of his heart rhythm over 2  weeks but as symptoms are so sporadic and generally sparse, Zio would likely be low yield.  Follow-up in 6 months or sooner if necessary.  COVID-19 Education: The signs and symptoms of COVID-19 were discussed with the patient and how to seek care for testing (follow up with PCP or arrange E-visit).  The importance of social distancing was discussed today.  Patient Risk:   After full review of this patient's history and clinical status, I feel that he is at least moderate risk for cardiac complications at this time, thus necessitating a telehealth visit sooner than our first available in office visit.  Time:   Today, I have spent 15 minutes with the patient with telehealth technology discussing medical history, symptoms, and management plan.   Nicolasa Ducking, NP 10/06/2019, 4:42 PM

## 2019-10-06 NOTE — Patient Instructions (Signed)
Medication Instructions:  Your physician recommends that you continue on your current medications as directed. Please refer to the Current Medication list given to you today.  *If you need a refill on your cardiac medications before your next appointment, please call your pharmacy*   Lab Work: None Ordered If you have labs (blood work) drawn today and your tests are completely normal, you will receive your results only by: [name] MyChart Message (if you have MyChart) OR . A paper copy in the mail If you have any lab test that is abnormal or we need to change your treatment, we will call you to review the results.   Testing/Procedures: None Ordered   Follow-Up: At [facility], you and your health needs are our priority.  As part of our continuing mission to provide you with exceptional heart care, we have created designated Provider Care Teams.  These Care Teams include your primary Cardiologist (physician) and Advanced Practice Providers (APPs -  Physician Assistants and Nurse Practitioners) who all work together to provide you with the care you need, when you need it.  We recommend signing up for the patient portal called "MyChart".  Sign up information is provided on this After Visit Summary.  MyChart is used to connect with patients for Virtual Visits (Telemedicine).  Patients are able to view lab/test results, encounter notes, upcoming appointments, etc.  Non-urgent messages can be sent to your provider as well.   To learn more about what you can do with MyChart, go to [email or web address].    Your next appointment:   6 month(s)  The format for your next appointment:   In person or virtual  Provider:   Debbe Odea, MD   Other Instructions

## 2019-12-14 ENCOUNTER — Encounter: Payer: Self-pay | Admitting: Family Medicine

## 2019-12-31 ENCOUNTER — Telehealth: Payer: Self-pay

## 2019-12-31 NOTE — Telephone Encounter (Signed)
Noted  

## 2019-12-31 NOTE — Telephone Encounter (Signed)
Pt said starting on  12/29/19 pt began feeling tired and the only other description of feelings was the pt "felt off". Pt does not have CP,SOB,not tachycardia, no H/A, no dizziness and no vision changes.pt said last few days BP 144/93 - 95 and P 70's. Pt is taking metoprolol 25 mg bid and has not missed medication. Early this morning BP 144/98; now 138/88. No covid symptoms. Pt said he is not in distress and pt scheduled appt with Dr Reece Agar on 01/03/20 at 8:30; pt thinks might be good idea to do some lab testing and will talk with Dr Reece Agar about that at appt. UC & ED precautions given and pt voiced understanding.

## 2020-01-03 ENCOUNTER — Other Ambulatory Visit: Payer: Self-pay

## 2020-01-03 ENCOUNTER — Other Ambulatory Visit (INDEPENDENT_AMBULATORY_CARE_PROVIDER_SITE_OTHER): Payer: 59

## 2020-01-03 ENCOUNTER — Ambulatory Visit (INDEPENDENT_AMBULATORY_CARE_PROVIDER_SITE_OTHER): Payer: 59 | Admitting: Family Medicine

## 2020-01-03 ENCOUNTER — Encounter: Payer: Self-pay | Admitting: Family Medicine

## 2020-01-03 VITALS — BP 150/80 | HR 91 | Temp 97.5°F | Ht 68.5 in | Wt 294.1 lb

## 2020-01-03 DIAGNOSIS — G4733 Obstructive sleep apnea (adult) (pediatric): Secondary | ICD-10-CM | POA: Diagnosis not present

## 2020-01-03 DIAGNOSIS — R5381 Other malaise: Secondary | ICD-10-CM | POA: Diagnosis not present

## 2020-01-03 DIAGNOSIS — R7989 Other specified abnormal findings of blood chemistry: Secondary | ICD-10-CM

## 2020-01-03 DIAGNOSIS — R5383 Other fatigue: Secondary | ICD-10-CM | POA: Diagnosis not present

## 2020-01-03 DIAGNOSIS — Z9989 Dependence on other enabling machines and devices: Secondary | ICD-10-CM

## 2020-01-03 DIAGNOSIS — Z6841 Body Mass Index (BMI) 40.0 and over, adult: Secondary | ICD-10-CM

## 2020-01-03 DIAGNOSIS — I10 Essential (primary) hypertension: Secondary | ICD-10-CM | POA: Diagnosis not present

## 2020-01-03 DIAGNOSIS — K219 Gastro-esophageal reflux disease without esophagitis: Secondary | ICD-10-CM

## 2020-01-03 LAB — BASIC METABOLIC PANEL
BUN: 17 mg/dL (ref 6–23)
CO2: 28 mEq/L (ref 19–32)
Calcium: 9.6 mg/dL (ref 8.4–10.5)
Chloride: 100 mEq/L (ref 96–112)
Creatinine, Ser: 0.93 mg/dL (ref 0.40–1.50)
GFR: 109.42 mL/min (ref >60.00)
Glucose, Bld: 75 mg/dL (ref 70–99)
Potassium: 4.5 mEq/L (ref 3.5–5.1)
Sodium: 137 mEq/L (ref 135–145)

## 2020-01-03 LAB — T4, FREE: Free T4: 0.83 ng/dL (ref 0.60–1.60)

## 2020-01-03 LAB — VITAMIN B12: Vitamin B-12: 435 pg/mL (ref 211–911)

## 2020-01-03 LAB — TSH: TSH: 5.2 u[IU]/mL — ABNORMAL HIGH (ref 0.35–4.50)

## 2020-01-03 LAB — T3, FREE: T3, Free: 4 pg/mL (ref 2.3–4.2)

## 2020-01-03 MED ORDER — METOPROLOL TARTRATE 25 MG PO TABS: 37.5000 mg | ORAL_TABLET | Freq: Two times a day (BID) | ORAL | 1 refills | Status: DC

## 2020-01-03 MED ORDER — FAMOTIDINE 20 MG PO TABS
20.0000 mg | ORAL_TABLET | Freq: Every day | ORAL | Status: DC
Start: 2020-01-03 — End: 2021-06-18

## 2020-01-03 NOTE — Assessment & Plan Note (Signed)
Nonspecific feeling of malaise - check labwork including b12 and tsh. Will increase metoprolol for better hypertensive control and reassess. Update if worsening fatigue when titrating metoprolol.  ?anxiety driven - discussed considering addition of SSRI or other daily mood medication.

## 2020-01-03 NOTE — Assessment & Plan Note (Signed)
Well managed with once daily pepcid.

## 2020-01-03 NOTE — Progress Notes (Signed)
Patient ID: Micheal [name], male    DOB: 03-Sep-1990, 29 y.o.   MRN: [id number]  This visit was conducted in person.  BP (!) 150/80 (BP Location: Right Arm, Patient Position: Sitting, Cuff Size: Large)   Pulse 91   Temp (!) 97.5 F (36.4 C) (Temporal)   Ht 5' 8.5" (1.74 m)   Wt 294 lb 1 oz (133.4 kg)   SpO2 99%   BMI 44.06 kg/m   150s/82 on recheck  CC: malaise, fatigue Subjective:   HPI: Micheal [name] is a 29 y.o. male presenting on 01/03/2020 for Fatigue (C/o feeling tired, elevated BP and just feeling "off".  Also, has had some shakiness.  Sxs started 12/29/19.  Pt brought home BP monitor to compare.  Reading in office today, 166/95.)   Over the past few days notes increasing fatigue, feeling "off" and slightly shaky. In setting of weight gain and increased blood pressure readings recently at home (138-144/88-98).   Wonders about anxiety driving symptoms. Upcoming new job he's excited for - opportunity for job growth at [employer].   No fever, chest pain, dyspnea, palpitations, dizziness, headaches.   Eosinophilia seen by Dr Orlie Dakin - reassuring evaluation rec yearly CBC to monitor this.      Relevant past medical, surgical, family and social history reviewed and updated as indicated. Interim medical history since our last visit reviewed. Allergies and medications reviewed and updated. Outpatient Medications Prior to Visit  Medication Sig Dispense Refill  . augmented betamethasone dipropionate (DIPROLENE-AF) 0.05 % cream APPLY TO AFFECTED AREA ON ELBOWS TWICE A DAY UNTIL CLEAR, THEN USE AS NEEDED    . calcipotriene (DOVONOX) 0.005 % cream APPLY ON THE SKIN AS DIRECTED TWICE DAILY TO FACE AND FOLD OF SKIN UNTIL CLEAR THEN AS NEEDED    . ketoconazole (NIZORAL) 2 % shampoo APPLY TO AFFECTED AREA EVERY DAY    . levocetirizine (XYZAL) 5 MG tablet Take 5 mg by mouth every evening.    . famotidine (PEPCID) 20 MG tablet Take 1 tablet (20 mg total) by mouth 2 (two) times  daily. (Patient taking differently: Take 20 mg by mouth daily.) 30 tablet 0  . metoprolol tartrate (LOPRESSOR) 25 MG tablet Take 1 tablet (25 mg total) by mouth 2 (two) times daily. 60 tablet 11   No facility-administered medications prior to visit.     Per HPI unless specifically indicated in ROS section below Review of Systems Objective:  BP (!) 150/80 (BP Location: Right Arm, Patient Position: Sitting, Cuff Size: Large)   Pulse 91   Temp (!) 97.5 F (36.4 C) (Temporal)   Ht 5' 8.5" (1.74 m)   Wt 294 lb 1 oz (133.4 kg)   SpO2 99%   BMI 44.06 kg/m   Wt Readings from Last 3 Encounters:  01/03/20 294 lb 1 oz (133.4 kg)  10/06/19 286 lb (129.7 kg)  07/08/19 289 lb 4 oz (131.2 kg)      Physical Exam Vitals and nursing note reviewed.  Constitutional:      Appearance: Normal appearance. He is not ill-appearing.  Eyes:     Extraocular Movements: Extraocular movements intact.     Pupils: Pupils are equal, round, and reactive to light.  Cardiovascular:     Rate and Rhythm: Normal rate and regular rhythm.     Pulses: Normal pulses.     Heart sounds: Normal heart sounds. No murmur heard.   Pulmonary:     Effort: Pulmonary effort is normal. No respiratory distress.  Breath sounds: Normal breath sounds. No wheezing, rhonchi or rales.  Musculoskeletal:     Right lower leg: No edema.     Left lower leg: No edema.  Neurological:     Mental Status: He is alert.  Psychiatric:        Mood and Affect: Mood normal.        Behavior: Behavior normal.       Results for orders placed or performed in visit on 09/03/19  CBC with Differential  Result Value Ref Range   WBC 9.3 4.0 - 10.5 K/uL   RBC 4.93 4.22 - 5.81 MIL/uL   Hemoglobin 14.2 13.0 - 17.0 g/dL   HCT 25.4 27.0 - 62.3 %   MCV 83.6 80.0 - 100.0 fL   MCH 28.8 26.0 - 34.0 pg   MCHC 34.5 30.0 - 36.0 g/dL   RDW 76.2 83.1 - 51.7 %   Platelets 249 150 - 400 K/uL   nRBC 0.0 0.0 - 0.2 %   Neutrophils Relative % 42 %   Neutro  Abs 3.9 1.7 - 7.7 K/uL   Lymphocytes Relative 20 %   Lymphs Abs 1.9 0.7 - 4.0 K/uL   Monocytes Relative 6 %   Monocytes Absolute 0.5 0.1 - 1.0 K/uL   Eosinophils Relative 31 %   Eosinophils Absolute 2.9 (H) 0.0 - 0.5 K/uL   Basophils Relative 1 %   Basophils Absolute 0.1 0.0 - 0.1 K/uL   Immature Granulocytes 0 %   Abs Immature Granulocytes 0.03 0.00 - 0.07 K/uL   Assessment & Plan:  This visit occurred during the SARS-CoV-2 public health emergency.  Safety protocols were in place, including screening questions prior to the visit, additional usage of staff PPE, and extensive cleaning of exam room while observing appropriate contact time as indicated for disinfecting solutions.   Problem List Items Addressed This Visit    OSA on CPAP    Compliant with nightly CPAP.       Morbid obesity with BMI of 40.0-44.9, adult (HCC)    Weight gain noted.       Malaise and fatigue - Primary    Nonspecific feeling of malaise - check labwork including b12 and tsh. Will increase metoprolol for better hypertensive control and reassess. Update if worsening fatigue when titrating metoprolol.  ?anxiety driven - discussed considering addition of SSRI or other daily mood medication.       Relevant Orders   Vitamin B12   GERD (gastroesophageal reflux disease)    Well managed with once daily pepcid.       Relevant Medications   famotidine (PEPCID) 20 MG tablet   Essential hypertension    Chronic, deteriorated. Will increase metoprolol to 37.5mg  BID, update with effect.       Relevant Medications   metoprolol tartrate (LOPRESSOR) 25 MG tablet   Other Relevant Orders   TSH   Basic metabolic panel       Meds ordered this encounter  Medications  . famotidine (PEPCID) 20 MG tablet    Sig: Take 1 tablet (20 mg total) by mouth at bedtime.  . metoprolol tartrate (LOPRESSOR) 25 MG tablet    Sig: Take 1.5 tablets (37.5 mg total) by mouth 2 (two) times daily.    Dispense:  270 tablet    Refill:  1    Orders Placed This Encounter  Procedures  . TSH  . Basic metabolic panel  . Vitamin B12    Patient Instructions  Labs today  Increase metoprolol  to 37.5mg  (1.5 tablets) twice daily.  If ongoing trouble with anxiety feelings, let me know to consider daily anxiety medication.  Good to see you today  Return in 3 months for physical   Follow up plan: Return in about 3 months (around 04/02/2020) for annual exam, prior fasting for blood work.  Eustaquio Boyden, MD

## 2020-01-03 NOTE — Assessment & Plan Note (Signed)
Compliant with nightly CPAP 

## 2020-01-03 NOTE — Assessment & Plan Note (Signed)
Chronic, deteriorated. Will increase metoprolol to 37.5mg  BID, update with effect.

## 2020-01-03 NOTE — Assessment & Plan Note (Signed)
Weight gain noted.  

## 2020-01-03 NOTE — Patient Instructions (Addendum)
Labs today  Increase metoprolol to 37.5mg  (1.5 tablets) twice daily.  If ongoing trouble with anxiety feelings, let me know to consider daily anxiety medication.  Good to see you today  Return in 3 months for physical

## 2020-01-05 NOTE — Telephone Encounter (Signed)
consented

## 2020-01-29 IMAGING — DX DG CHEST 2V
2 series · 2 of 2 positions shown · non-contrast
Comparison: No prior.

CLINICAL DATA: Palpitation.  Lightheadedness.

EXAM:
CHEST - 2 VIEW

[chest pa]
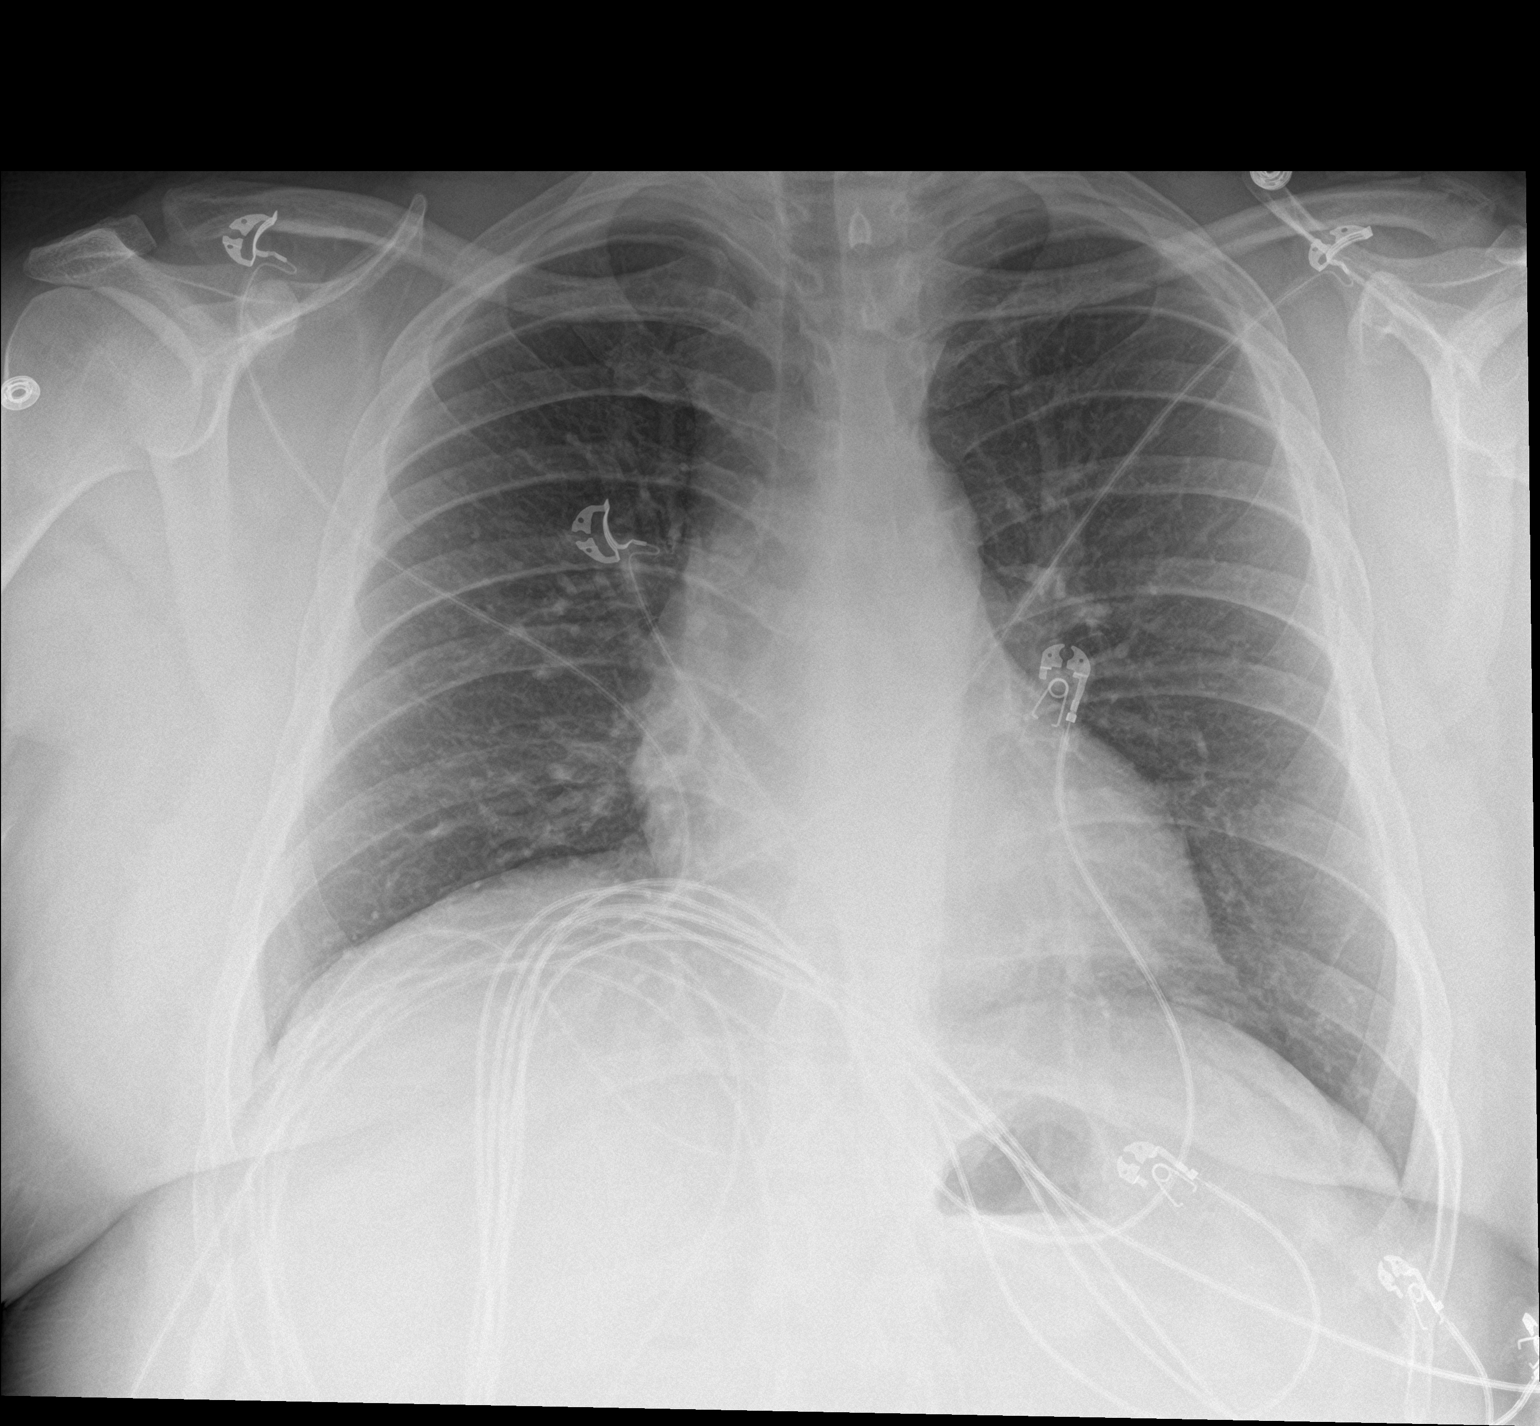

[chest lat]
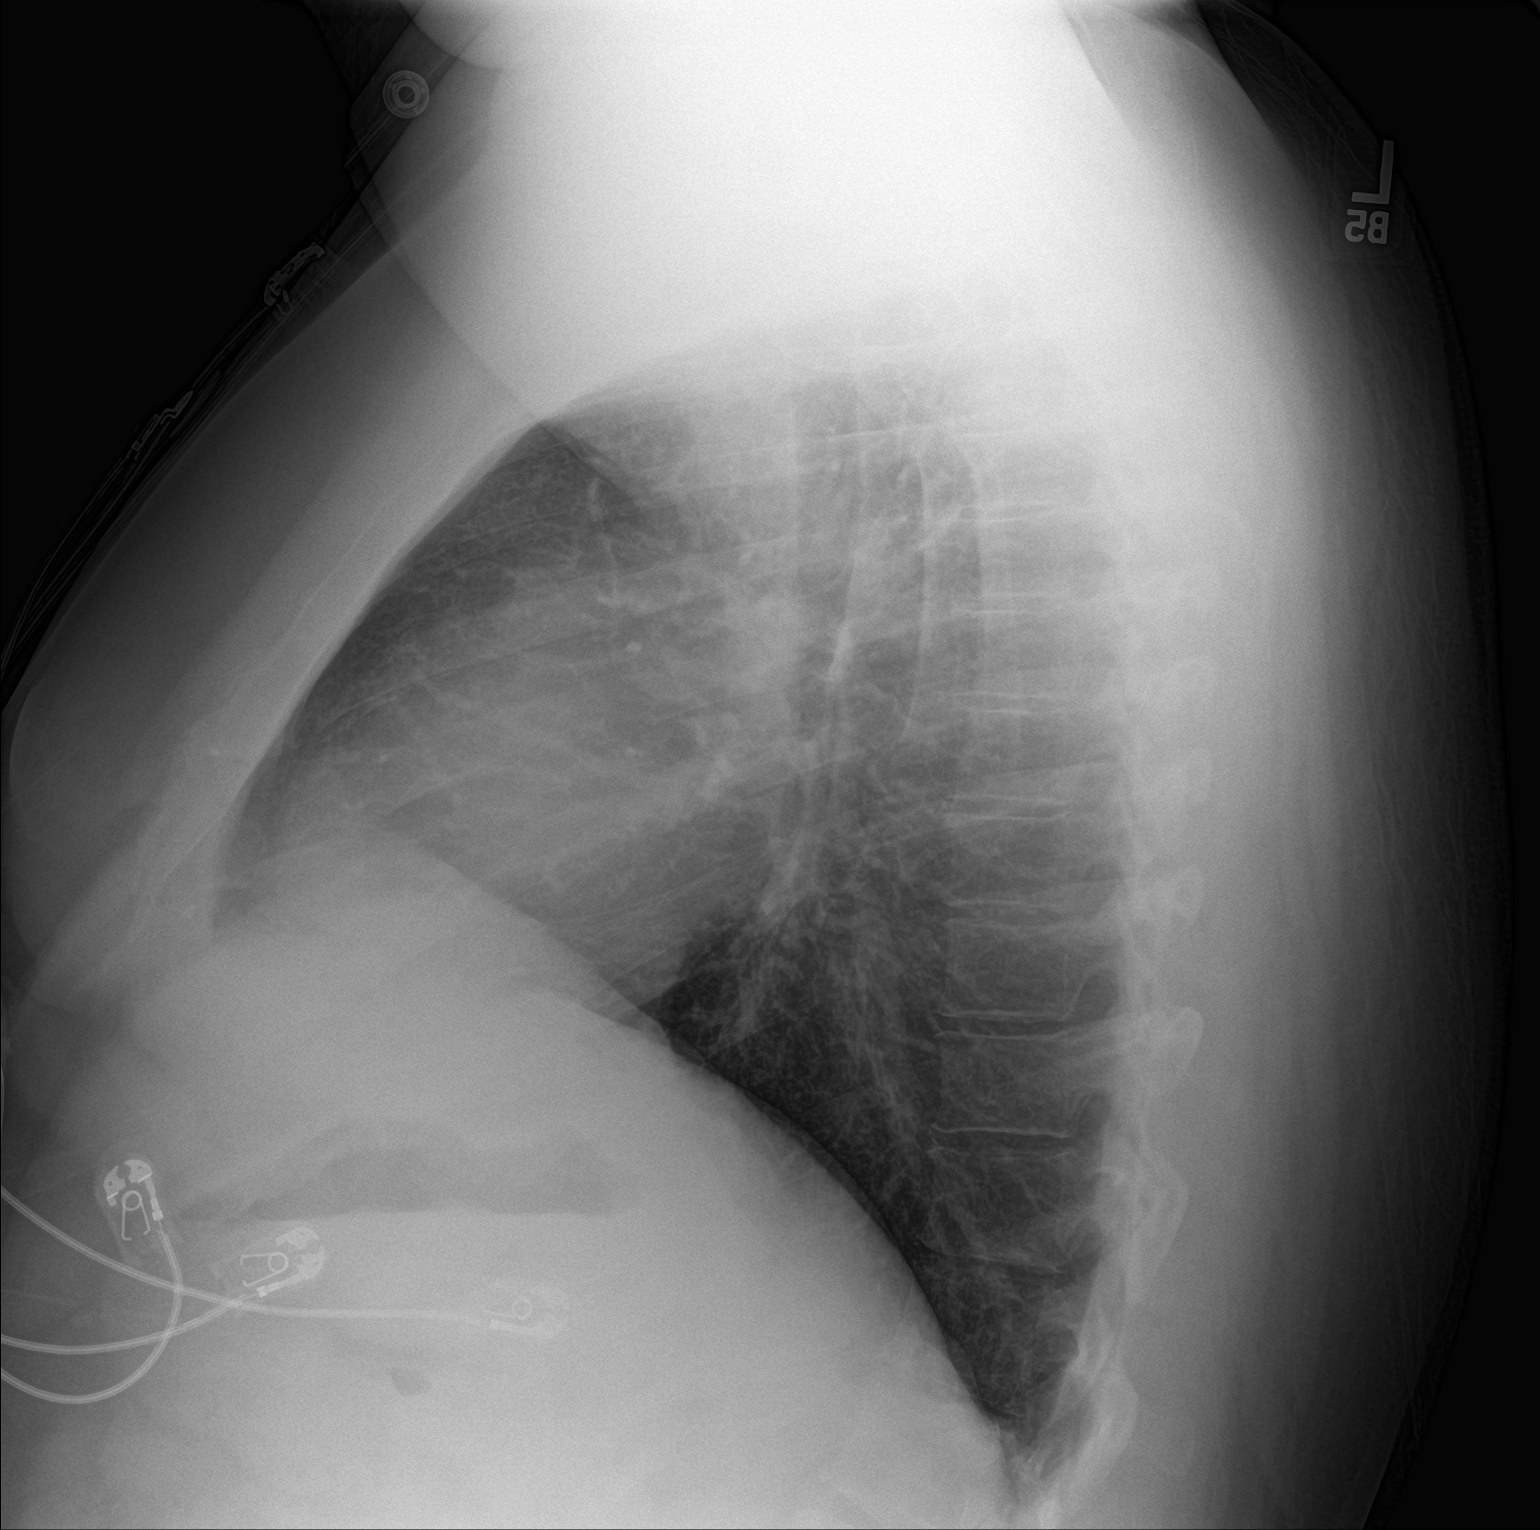

[2 of 2 positions shown; findings below may reference images not displayed]

FINDINGS: Mediastinum and hilar structures normal. Low lung volumes with mild
bibasilar atelectasis. No pleural effusion or pneumothorax. Heart
size normal. No acute bony abnormality
IMPRESSION: Low lung volumes with mild bibasilar atelectasis.

## 2020-03-29 ENCOUNTER — Other Ambulatory Visit: Payer: Self-pay

## 2020-03-29 ENCOUNTER — Other Ambulatory Visit (INDEPENDENT_AMBULATORY_CARE_PROVIDER_SITE_OTHER): Payer: Managed Care, Other (non HMO)

## 2020-03-29 ENCOUNTER — Other Ambulatory Visit: Payer: Self-pay | Admitting: Family Medicine

## 2020-03-29 DIAGNOSIS — R946 Abnormal results of thyroid function studies: Secondary | ICD-10-CM | POA: Diagnosis not present

## 2020-03-29 DIAGNOSIS — E782 Mixed hyperlipidemia: Secondary | ICD-10-CM | POA: Diagnosis not present

## 2020-03-29 DIAGNOSIS — I10 Essential (primary) hypertension: Secondary | ICD-10-CM

## 2020-03-29 LAB — MICROALBUMIN / CREATININE URINE RATIO
Creatinine,U: 93.5 mg/dL
Microalb Creat Ratio: 0.7 mg/g (ref 0.0–30.0)
Microalb, Ur: <0.7 mg/dL (ref 0.0–1.9)

## 2020-03-29 LAB — COMPREHENSIVE METABOLIC PANEL
ALT: 47 U/L (ref 0–53)
AST: 25 U/L (ref 0–37)
Albumin: 4.4 g/dL (ref 3.5–5.2)
Alkaline Phosphatase: 72 U/L (ref 39–117)
BUN: 15 mg/dL (ref 6–23)
CO2: 30 mEq/L (ref 19–32)
Calcium: 9.8 mg/dL (ref 8.4–10.5)
Chloride: 101 mEq/L (ref 96–112)
Creatinine, Ser: 0.98 mg/dL (ref 0.40–1.50)
GFR: 102.59 mL/min (ref >60.00)
Glucose, Bld: 81 mg/dL (ref 70–99)
Potassium: 4.7 mEq/L (ref 3.5–5.1)
Sodium: 138 mEq/L (ref 135–145)
Total Bilirubin: 0.5 mg/dL (ref 0.2–1.2)
Total Protein: 7.6 g/dL (ref 6.0–8.3)

## 2020-03-29 LAB — LIPID PANEL
Cholesterol: 184 mg/dL (ref 0–200)
HDL: 34.3 mg/dL — ABNORMAL LOW (ref >39.00)
LDL Cholesterol: 115 mg/dL — ABNORMAL HIGH (ref 0–99)
NonHDL: 149.32
Total CHOL/HDL Ratio: 5
Triglycerides: 172 mg/dL — ABNORMAL HIGH (ref 0.0–149.0)
VLDL: 34.4 mg/dL (ref 0.0–40.0)

## 2020-03-29 LAB — T4, FREE: Free T4: 0.74 ng/dL (ref 0.60–1.60)

## 2020-03-29 LAB — TSH: TSH: 6.19 u[IU]/mL — ABNORMAL HIGH (ref 0.35–4.50)

## 2020-03-30 LAB — T3: T3, Total: 118 ng/dL (ref 76–181)

## 2020-04-05 ENCOUNTER — Ambulatory Visit (INDEPENDENT_AMBULATORY_CARE_PROVIDER_SITE_OTHER): Payer: Managed Care, Other (non HMO) | Admitting: Family Medicine

## 2020-04-05 ENCOUNTER — Other Ambulatory Visit: Payer: Self-pay

## 2020-04-05 ENCOUNTER — Encounter: Payer: Self-pay | Admitting: Family Medicine

## 2020-04-05 VITALS — BP 132/74 | HR 79 | Temp 98.1°F | Ht 69.75 in | Wt 297.0 lb

## 2020-04-05 DIAGNOSIS — G4733 Obstructive sleep apnea (adult) (pediatric): Secondary | ICD-10-CM | POA: Diagnosis not present

## 2020-04-05 DIAGNOSIS — K219 Gastro-esophageal reflux disease without esophagitis: Secondary | ICD-10-CM

## 2020-04-05 DIAGNOSIS — Z0000 Encounter for general adult medical examination without abnormal findings: Secondary | ICD-10-CM | POA: Diagnosis not present

## 2020-04-05 DIAGNOSIS — Z6841 Body Mass Index (BMI) 40.0 and over, adult: Secondary | ICD-10-CM

## 2020-04-05 DIAGNOSIS — Z9989 Dependence on other enabling machines and devices: Secondary | ICD-10-CM

## 2020-04-05 DIAGNOSIS — L409 Psoriasis, unspecified: Secondary | ICD-10-CM

## 2020-04-05 DIAGNOSIS — E039 Hypothyroidism, unspecified: Secondary | ICD-10-CM

## 2020-04-05 DIAGNOSIS — D721 Eosinophilia, unspecified: Secondary | ICD-10-CM

## 2020-04-05 DIAGNOSIS — E782 Mixed hyperlipidemia: Secondary | ICD-10-CM

## 2020-04-05 DIAGNOSIS — I10 Essential (primary) hypertension: Secondary | ICD-10-CM

## 2020-04-05 MED ORDER — LEVOTHYROXINE SODIUM 50 MCG PO TABS: 50.0000 ug | ORAL_TABLET | Freq: Every day | ORAL | 6 refills | Status: DC

## 2020-04-05 MED ORDER — METOPROLOL TARTRATE 25 MG PO TABS: 37.5000 mg | ORAL_TABLET | Freq: Two times a day (BID) | ORAL | 3 refills | Status: DC

## 2020-04-05 MED ORDER — MULTIVITAMIN ADULT PO TABS: 1.0000 | ORAL_TABLET | Freq: Every day | ORAL | Status: AC

## 2020-04-05 NOTE — Assessment & Plan Note (Signed)
Continue yearly CBC.

## 2020-04-05 NOTE — Patient Instructions (Addendum)
Trial thyroid medicine daily sent to pharmacy.  You are doing well today. Continue regular walking/aerobic exercise routine. Work on [employer].  Return in 6 weeks for lab visit only to recheck thyroid levels.  Return in 6 months for follow up visit.   Health Maintenance, Male Adopting a healthy lifestyle and getting preventive care are important in promoting health and wellness. Ask your health care provider about:  The right schedule for you to have regular tests and exams.  Things you can do on your own to prevent diseases and keep yourself healthy. What should I know about diet, weight, and exercise? Eat a healthy diet  Eat a diet that includes plenty of vegetables, fruits, low-fat dairy products, and lean protein.  Do not eat a lot of foods that are high in solid fats, added sugars, or sodium.   Maintain a healthy weight Body mass index (BMI) is a measurement that can be used to identify possible weight problems. It estimates body fat based on height and weight. Your health care provider can help determine your BMI and help you achieve or maintain a healthy weight. Get regular exercise Get regular exercise. This is one of the most important things you can do for your health. Most adults should:  Exercise for at least 150 minutes each week. The exercise should increase your heart rate and make you sweat (moderate-intensity exercise).  Do strengthening exercises at least twice a week. This is in addition to the moderate-intensity exercise.  Spend less time sitting. Even light physical activity can be beneficial. Watch cholesterol and blood lipids Have your blood tested for lipids and cholesterol at 32 years of age, then have this test every 5 years. You may need to have your cholesterol levels checked more often if:  Your lipid or cholesterol levels are high.  You are older than 32 years of age.  You are at high risk for heart disease. What should I know about cancer  screening? Many types of cancers can be detected early and may often be prevented. Depending on your health history and family history, you may need to have cancer screening at various ages. This may include screening for:  Colorectal cancer.  Prostate cancer.  Skin cancer.  Lung cancer. What should I know about heart disease, diabetes, and high blood pressure? Blood pressure and heart disease  High blood pressure causes heart disease and increases the risk of stroke. This is more likely to develop in people who have high blood pressure readings, are of African descent, or are overweight.  Talk with your health care provider about your target blood pressure readings.  Have your blood pressure checked: ? Every 3-5 years if you are 49-65 years of age. ? Every year if you are 63 years old or older.  If you are between the ages of 7 and 48 and are a current or former smoker, ask your health care provider if you should have a one-time screening for abdominal aortic aneurysm (AAA). Diabetes Have regular diabetes screenings. This checks your fasting blood sugar level. Have the screening done:  Once every three years after age 58 if you are at a normal weight and have a low risk for diabetes.  More often and at a younger age if you are overweight or have a high risk for diabetes. What should I know about preventing infection? Hepatitis B If you have a higher risk for hepatitis B, you should be screened for this virus. Talk with your health  care provider to find out if you are at risk for hepatitis B infection. Hepatitis C Blood testing is recommended for:  Everyone born from [birth year] through 1967.  Anyone with known risk factors for hepatitis C. Sexually transmitted infections (STIs)  You should be screened each year for STIs, including gonorrhea and chlamydia, if: ? You are sexually active and are younger than 32 years of age. ? You are older than 32 years of age and your health care  provider tells you that you are at risk for this type of infection. ? Your sexual activity has changed since you were last screened, and you are at increased risk for chlamydia or gonorrhea. Ask your health care provider if you are at risk.  Ask your health care provider about whether you are at high risk for HIV. Your health care provider may recommend a prescription medicine to help prevent HIV infection. If you choose to take medicine to prevent HIV, you should first get tested for HIV. You should then be tested every 3 months for as long as you are taking the medicine. Follow these instructions at home: Lifestyle  Do not use any products that contain nicotine or tobacco, such as cigarettes, e-cigarettes, and chewing tobacco. If you need help quitting, ask your health care provider.  Do not use street drugs.  Do not share needles.  Ask your health care provider for help if you need support or information about quitting drugs. Alcohol use  Do not drink alcohol if your health care provider tells you not to drink.  If you drink alcohol: ? Limit how much you have to 0-2 drinks a day. ? Be aware of how much alcohol is in your drink. In the [place] one drink equals one 12 oz bottle of beer (355 mL), one 5 oz glass of wine (148 mL), or one 1 oz glass of hard liquor (44 mL). General instructions  Schedule regular health, dental, and eye exams.  Stay current with your vaccines.  Tell your health care provider if: ? You often feel depressed. ? You have ever been abused or do not feel safe at home. Summary  Adopting a healthy lifestyle and getting preventive care are important in promoting health and wellness.  Follow your health care provider's instructions about healthy diet, exercising, and getting tested or screened for diseases.  Follow your health care provider's instructions on monitoring your cholesterol and blood pressure. This information is not intended to replace advice given  to you by your health care provider. Make sure you discuss any questions you have with your health care provider. Document Revised: 12/24/2017 Document Reviewed: 12/24/2017 Elsevier Patient Education  2021 [employer].

## 2020-04-05 NOTE — Assessment & Plan Note (Signed)
-  Continue nightly CPAP °

## 2020-04-05 NOTE — Progress Notes (Signed)
Patient ID: Micheal [name], male    DOB: 29-Dec-1990, 30 y.o.   MRN: [id number]  This visit was conducted in person.  BP 132/74 (BP Location: Left Arm, Patient Position: Sitting, Cuff Size: Large)   Pulse 79   Temp 98.1 F (36.7 C) (Temporal)   Ht 5' 9.75" (1.772 m)   Wt 297 lb (134.7 kg)   SpO2 95%   BMI 42.92 kg/m    CC: CPE  Subjective:   HPI: Micheal [name] is a 30 y.o. 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  Recent trip to [place] with family 01/2020  Eosinophilia seen by Dr Orlie Dakin - reassuring evaluation rec yearly CBC to monitor this.   Obesity - more active during lunch break - walking 1 mile at lunch  Monitoring hypothyridism - no significant constipation, fatigue, skin or hair changes. Trouble losing weight and cold intolerance.   Preventative: Flu shot 01/2020 COVID vaccine - discussed, declines  Tdap 10/2015  Seat belt use discussed  Sunscreen use discussed. No changing moles on skin  Non smoker  Alcohol - none  Dentist q6 mo Eye exam yearly - due  1 cup coffee a few times a week  Lives with parents Edu: BS Occ: HR rep at [employer]  Activity: active at church - [facility] [employer]. Walking 1 mile 4-5 times per week, also has elliptical he can use  Diet: good water, vegetables daily      Relevant past medical, surgical, family and social history reviewed and updated as indicated. Interim medical history since our last visit reviewed. Allergies and medications reviewed and updated. Outpatient Medications Prior to Visit  Medication Sig Dispense Refill  . augmented betamethasone dipropionate (DIPROLENE-AF) 0.05 % cream APPLY TO AFFECTED AREA ON ELBOWS TWICE A DAY UNTIL CLEAR, THEN USE AS NEEDED    . calcipotriene (DOVONOX) 0.005 % cream APPLY ON THE SKIN AS DIRECTED TWICE DAILY TO FACE AND FOLD OF SKIN UNTIL CLEAR THEN AS NEEDED    . famotidine (PEPCID) 20 MG tablet Take 1 tablet (20 mg total)  by mouth at bedtime.    [name] ketoconazole (NIZORAL) 2 % shampoo APPLY TO AFFECTED AREA EVERY DAY    . levocetirizine (XYZAL) 5 MG tablet Take 5 mg by mouth every evening.    . metoprolol tartrate (LOPRESSOR) 25 MG tablet Take 1.5 tablets (37.5 mg total) by mouth 2 (two) times daily. 270 tablet 1   No facility-administered medications prior to visit.     Per HPI unless specifically indicated in ROS section below Review of Systems  Constitutional: Negative for activity change, appetite change, chills, fatigue, fever and unexpected weight change.  HENT: Negative for hearing loss.   Eyes: Negative for visual disturbance.  Respiratory: Negative for cough, chest tightness, shortness of breath and wheezing.   Cardiovascular: Negative for chest pain, palpitations and leg swelling.  Gastrointestinal: Positive for constipation (mild). Negative for abdominal distention, abdominal pain, blood in stool, diarrhea, nausea and vomiting.  Endocrine: Positive for cold intolerance.  Genitourinary: Negative for difficulty urinating and hematuria.  Musculoskeletal: Negative for arthralgias, myalgias and neck pain.  Skin: Negative for rash.  Neurological: Negative for dizziness, seizures, syncope and headaches.  Hematological: Negative for adenopathy. Does not bruise/bleed easily.  Psychiatric/Behavioral: Negative for dysphoric mood. The patient is nervous/anxious (mild).    Objective:  BP 132/74 (BP Location: Left Arm, Patient Position: Sitting, Cuff Size: Large)   Pulse 79  Temp 98.1 F (36.7 C) (Temporal)   Ht 5' 9.75" (1.772 m)   Wt 297 lb (134.7 kg)   SpO2 95%   BMI 42.92 kg/m   Wt Readings from Last 3 Encounters:  04/05/20 297 lb (134.7 kg)  01/03/20 294 lb 1 oz (133.4 kg)  10/06/19 286 lb (129.7 kg)      Physical Exam Vitals and nursing note reviewed.  Constitutional:      General: He is not in acute distress.    Appearance: He is well-developed.  HENT:     Head: Normocephalic and  atraumatic.     Right Ear: Hearing, tympanic membrane, ear canal and external ear normal.     Left Ear: Hearing, tympanic membrane, ear canal and external ear normal.     Nose: Nose normal.     Mouth/Throat:     Pharynx: Uvula midline. No oropharyngeal exudate or posterior oropharyngeal erythema.  Eyes:     General: No scleral icterus.    Conjunctiva/sclera: Conjunctivae normal.     Pupils: Pupils are equal, round, and reactive to light.  Cardiovascular:     Rate and Rhythm: Normal rate and regular rhythm.     Pulses:          Radial pulses are 2+ on the right side and 2+ on the left side.     Heart sounds: Normal heart sounds. No murmur heard.   Pulmonary:     Effort: Pulmonary effort is normal. No respiratory distress.     Breath sounds: Normal breath sounds. No wheezing or rales.  Abdominal:     General: Bowel sounds are normal. There is no distension.     Palpations: Abdomen is soft. There is no mass.     Tenderness: There is no abdominal tenderness. There is no guarding or rebound.  Musculoskeletal:        General: Normal range of motion.     Cervical back: Normal range of motion and neck supple.  Lymphadenopathy:     Cervical: No cervical adenopathy.  Skin:    General: Skin is warm and dry.     Findings: No rash.  Neurological:     Mental Status: He is alert and oriented to person, place, and time.     Comments: CN grossly intact, station and gait intact  Psychiatric:        Behavior: Behavior normal.        Thought Content: Thought content normal.        Judgment: Judgment normal.       Results for orders placed or performed in visit on 03/29/20  T3  Result Value Ref Range   T3, Total 118 76 - 181 ng/dL  T4, free  Result Value Ref Range   Free T4 0.74 0.60 - 1.60 ng/dL  TSH  Result Value Ref Range   TSH 6.19 (H) 0.35 - 4.50 uIU/mL  Comprehensive metabolic panel  Result Value Ref Range   Sodium 138 135 - 145 mEq/L   Potassium 4.7 3.5 - 5.1 mEq/L    Chloride 101 96 - 112 mEq/L   CO2 30 19 - 32 mEq/L   Glucose, Bld 81 70 - 99 mg/dL   BUN 15 6 - 23 mg/dL   Creatinine, Ser 0.980.98 0.40 - 1.50 mg/dL   Total Bilirubin 0.5 0.2 - 1.2 mg/dL   Alkaline Phosphatase 72 39 - 117 U/L   AST 25 0 - 37 U/L   ALT 47 0 - 53 U/L   Total  Protein 7.6 6.0 - 8.3 g/dL   Albumin 4.4 3.5 - 5.2 g/dL   GFR 993.71 >69.67 mL/min   Calcium 9.8 8.4 - 10.5 mg/dL  Lipid panel  Result Value Ref Range   Cholesterol 184 0 - 200 mg/dL   Triglycerides 893.8 (H) 0.0 - 149.0 mg/dL   HDL 10.17 (L) >51.02 mg/dL   VLDL 58.5 0.0 - 27.7 mg/dL   LDL Cholesterol 824 (H) 0 - 99 mg/dL   Total CHOL/HDL Ratio 5    NonHDL 149.32   Microalbumin / creatinine urine ratio  Result Value Ref Range   Microalb, Ur <0.7 0.0 - 1.9 mg/dL   Creatinine,U 23.5 mg/dL   Microalb Creat Ratio 0.7 0.0 - 30.0 mg/g   Lab Results  Component Value Date   WBC 9.3 09/03/2019   HGB 14.2 09/03/2019   HCT 41.2 09/03/2019   MCV 83.6 09/03/2019   PLT 249 09/03/2019    Assessment & Plan:  This visit occurred during the SARS-CoV-2 public health emergency.  Safety protocols were in place, including screening questions prior to the visit, additional usage of staff PPE, and extensive cleaning of exam room while observing appropriate contact time as indicated for disinfecting solutions.   Problem List Items Addressed This Visit    Health maintenance examination    Preventative protocols reviewed and updated unless pt declined. Discussed healthy diet and lifestyle.       Morbid obesity with BMI of 40.0-44.9, adult (HCC)    Encouraged healthy diet and lifestyle changes to affect sustainable weight loss.        Psoriasis - Primary   OSA on CPAP    Continue nightly CPAP      Essential hypertension    Chronic, stable on current dose of metoprolol      Relevant Medications   metoprolol tartrate (LOPRESSOR) 25 MG tablet   Eosinophilia    Continue yearly CBC.       GERD (gastroesophageal reflux  disease)    Continue daily pepcid.       Mixed hyperlipidemia    Improving readings with healthy diet choices. The ASCVD Risk score Denman George DC Jr., et al., 2013) failed to calculate for the following reasons:   The 2013 ASCVD risk score is only valid for ages 55 to 34       Relevant Medications   metoprolol tartrate (LOPRESSOR) 25 MG tablet   Hypothyroidism    Borderline with strong fmhx. Endorses some hypothyroid symptoms. Trial levothyroxine daily, RTC 6 mo f/u visit.       Relevant Medications   metoprolol tartrate (LOPRESSOR) 25 MG tablet   levothyroxine (SYNTHROID) 50 MCG tablet       Meds ordered this encounter  Medications  . metoprolol tartrate (LOPRESSOR) 25 MG tablet    Sig: Take 1.5 tablets (37.5 mg total) by mouth 2 (two) times daily.    Dispense:  270 tablet    Refill:  3  . levothyroxine (SYNTHROID) 50 MCG tablet    Sig: Take 1 tablet (50 mcg total) by mouth daily.    Dispense:  30 tablet    Refill:  6  . Multiple Vitamin (MULTIVITAMIN ADULT) TABS    Sig: Take 1 tablet by mouth daily.   No orders of the defined types were placed in this encounter.   Patient instructions: Trial thyroid medicine daily sent to pharmacy.  You are doing well today. Continue regular walking/aerobic exercise routine. Work on [employer].  Return in 6 weeks  for lab visit only to recheck thyroid levels.  Return in 6 months for follow up visit.   Follow up plan: Return in about 6 months (around 10/06/2020) for follow up visit.  Eustaquio Boyden, MD

## 2020-04-05 NOTE — Assessment & Plan Note (Signed)
Chronic, stable on current dose of metoprolol

## 2020-04-05 NOTE — Assessment & Plan Note (Signed)
Encouraged healthy diet and lifestyle changes to affect sustainable weight loss.  

## 2020-04-05 NOTE — Assessment & Plan Note (Signed)
Continue daily pepcid.

## 2020-04-05 NOTE — Assessment & Plan Note (Signed)
Improving readings with healthy diet choices. The ASCVD Risk score Denman George DC Jr., et al., 2013) failed to calculate for the following reasons:   The 2013 ASCVD risk score is only valid for ages 62 to 75

## 2020-04-05 NOTE — Assessment & Plan Note (Signed)
Preventative protocols reviewed and updated unless pt declined. Discussed healthy diet and lifestyle.  

## 2020-04-05 NOTE — Assessment & Plan Note (Signed)
Borderline with strong fmhx. Endorses some hypothyroid symptoms. Trial levothyroxine daily, RTC 6 mo f/u visit.

## 2020-05-17 ENCOUNTER — Other Ambulatory Visit (INDEPENDENT_AMBULATORY_CARE_PROVIDER_SITE_OTHER): Payer: Managed Care, Other (non HMO)

## 2020-05-17 ENCOUNTER — Other Ambulatory Visit: Payer: Self-pay

## 2020-05-17 DIAGNOSIS — R7989 Other specified abnormal findings of blood chemistry: Secondary | ICD-10-CM | POA: Diagnosis not present

## 2020-05-18 LAB — T4: T4, Total: 8.2 ug/dL (ref 4.9–10.5)

## 2020-05-18 LAB — T3: T3, Total: 113 ng/dL (ref 76–181)

## 2020-08-30 ENCOUNTER — Encounter: Payer: Self-pay | Admitting: Family Medicine

## 2020-08-30 DIAGNOSIS — E039 Hypothyroidism, unspecified: Secondary | ICD-10-CM

## 2020-08-30 DIAGNOSIS — D721 Eosinophilia, unspecified: Secondary | ICD-10-CM

## 2020-08-30 DIAGNOSIS — R002 Palpitations: Secondary | ICD-10-CM

## 2020-09-06 ENCOUNTER — Other Ambulatory Visit (INDEPENDENT_AMBULATORY_CARE_PROVIDER_SITE_OTHER): Payer: Managed Care, Other (non HMO)

## 2020-09-06 ENCOUNTER — Other Ambulatory Visit: Payer: Self-pay

## 2020-09-06 DIAGNOSIS — D721 Eosinophilia, unspecified: Secondary | ICD-10-CM | POA: Diagnosis not present

## 2020-09-06 DIAGNOSIS — R002 Palpitations: Secondary | ICD-10-CM | POA: Diagnosis not present

## 2020-09-06 DIAGNOSIS — E039 Hypothyroidism, unspecified: Secondary | ICD-10-CM

## 2020-09-06 LAB — CBC WITH DIFFERENTIAL/PLATELET
Basophils Absolute: 0.1 10*3/uL (ref 0.0–0.1)
Basophils Relative: 0.7 % (ref 0.0–3.0)
Eosinophils Absolute: 1.7 10*3/uL — ABNORMAL HIGH (ref 0.0–0.7)
Eosinophils Relative: 21.8 % — ABNORMAL HIGH (ref 0.0–5.0)
HCT: 44.4 % (ref 39.0–52.0)
Hemoglobin: 15 g/dL (ref 13.0–17.0)
Lymphocytes Relative: 26.9 % (ref 12.0–46.0)
Lymphs Abs: 2.1 10*3/uL (ref 0.7–4.0)
MCHC: 33.8 g/dL (ref 30.0–36.0)
MCV: 84.2 fl (ref 78.0–100.0)
Monocytes Absolute: 0.5 10*3/uL (ref 0.1–1.0)
Monocytes Relative: 6.3 % (ref 3.0–12.0)
Neutro Abs: 3.5 10*3/uL (ref 1.4–7.7)
Neutrophils Relative %: 44.3 % (ref 43.0–77.0)
Platelets: 223 10*3/uL (ref 150.0–400.0)
RBC: 5.27 Mil/uL (ref 4.22–5.81)
RDW: 12.9 % (ref 11.5–15.5)
WBC: 7.8 10*3/uL (ref 4.0–10.5)

## 2020-09-06 LAB — BASIC METABOLIC PANEL
BUN: 12 mg/dL (ref 6–23)
CO2: 26 mEq/L (ref 19–32)
Calcium: 10.2 mg/dL (ref 8.4–10.5)
Chloride: 99 mEq/L (ref 96–112)
Creatinine, Ser: 0.92 mg/dL (ref 0.40–1.50)
GFR: 110.33 mL/min (ref >60.00)
Glucose, Bld: 77 mg/dL (ref 70–99)
Potassium: 4.6 mEq/L (ref 3.5–5.1)
Sodium: 137 mEq/L (ref 135–145)

## 2020-09-06 LAB — TSH: TSH: 4.22 u[IU]/mL (ref 0.35–5.50)

## 2020-10-06 ENCOUNTER — Ambulatory Visit: Payer: Managed Care, Other (non HMO) | Admitting: Family Medicine

## 2020-10-21 ENCOUNTER — Other Ambulatory Visit: Payer: Self-pay | Admitting: Family Medicine

## 2020-12-01 ENCOUNTER — Ambulatory Visit: Payer: Managed Care, Other (non HMO) | Admitting: Family Medicine

## 2021-01-29 ENCOUNTER — Telehealth: Payer: Self-pay

## 2021-01-29 ENCOUNTER — Encounter: Payer: Self-pay | Admitting: Family Medicine

## 2021-01-29 ENCOUNTER — Telehealth (INDEPENDENT_AMBULATORY_CARE_PROVIDER_SITE_OTHER): Payer: Managed Care, Other (non HMO) | Admitting: Family Medicine

## 2021-01-29 VITALS — BP 150/89 | HR 63 | Temp 97.4°F | Ht 69.75 in | Wt 290.0 lb

## 2021-01-29 DIAGNOSIS — I10 Essential (primary) hypertension: Secondary | ICD-10-CM | POA: Diagnosis not present

## 2021-01-29 DIAGNOSIS — J22 Unspecified acute lower respiratory infection: Secondary | ICD-10-CM | POA: Insufficient documentation

## 2021-01-29 MED ORDER — FLUTICASONE PROPIONATE 50 MCG/ACT NA SUSP: 2.0000 | Freq: Every day | NASAL | 1 refills | Status: DC

## 2021-01-29 NOTE — Telephone Encounter (Signed)
I spoke with pt; pt said today his ears are not  hurting but his ears feels stopped up, scratchy S/T, dry cough, runny nose,no fever, no SOB and no other covid symptoms. Pt has not tested for covid and does not have access to covid test at this time. Pt scheduled VV with Dr Reece Agar 01/29/21 at 11:30. UC precautions given and pt voiced understanding. Sending not to Dr Reece Agar and Misty Stanley CMA.

## 2021-01-29 NOTE — Telephone Encounter (Signed)
[facility] Night - Client TELEPHONE ADVICE RECORD [facility] Patient Name: Micheal [name] Gender: Male DOB: Sep 13, 1990 Age: 31 Y 6 M 25 D Return Phone Number: [phone] (Primary), [phone] (Secondary) Address: [place]  [address] Client [facility] Night - Client Client Site [facility] - Night Provider Eustaquio Boyden - MD Contact Type Call Who Is Calling Patient / Member / Family / Caregiver Call Type Triage / Clinical Relationship To Patient Self Return Phone Number [phone] (Primary) Chief Complaint Earache Reason for Call Symptomatic / Request for Health Information Initial Comment [name] states he has drainage and ear pain. Translation No Nurse Assessment Nurse: Milta Deiters, RN, Glee Arvin Date/Time (Eastern Time): 01/27/2021 11:34:14 PM Confirm and document reason for call. If symptomatic, describe symptoms. ---[name] states he has yellow drainage and R ear pain x 2 days ago, and both are clogged. Taking ibuprofen, using warm salt water and zicam. Denies fever. Does the patient have any new or worsening symptoms? ---Yes Will a triage be completed? ---Yes Related visit to physician within the last 2 weeks? ---No Does the PT have any chronic conditions? (i.e. diabetes, asthma, this includes High risk factors for pregnancy, etc.) ---Yes List chronic conditions. ---asthma Is this a behavioral health or substance abuse call? ---No Guidelines Guideline Title Affirmed Question Affirmed Notes Nurse Date/Time (Eastern Time) Earache White, yellow, or green discharge Milta Deiters, RN, Glee Arvin 01/27/2021 11:37:14 PM Disp. Time Lamount Cohen Time) Disposition Final User 01/27/2021 11:42:04 PM See PCP within 24 Hours Yes Milta Deiters, RN, Glee Arvin [name] Disagree/Comply Comply [name] Understands Yes PLEASE NOTE: All timestamps contained within this report are represented as [place]  Standard Time. CONFIDENTIALTY NOTICE: This fax transmission is intended only for the addressee. It contains information that is legally privileged, confidential or otherwise protected from use or disclosure. If you are not the intended recipient, you are strictly prohibited from reviewing, disclosing, copying using or disseminating any of this information or taking any action in reliance on or regarding this information. If you have received this fax in error, please notify us immediately by telephone so that we can arrange for its return to [place]. Phone: [phone], Toll-Free: [phone], Fax: [phone] Page: 2 of 2 Call Id: [id number] PreDisposition Home Care Care Advice Given Per Guideline SEE PCP WITHIN 24 HOURS: * IF OFFICE WILL BE CLOSED: You need to be seen within the next 24 hours. A clinic or an urgent care center is often a good source of care if your doctor's office is closed or you can't get an appointment. REASSURANCE AND EDUCATION: * You may have an ear infection or some other cause of ear pain; but it doesn't sound serious. You need to be examined in the next 24 hours to find out the exact cause. * In the meantime, the following measures may help. EAR DISCHARGE: * Wipe the discharge away as it appears. * Avoid plugging with cotton. Reason: Retained pus can cause infection of the lining of the ear canal. PAIN MEDICINES: * They are over-the-counter (OTC) pain drugs. You can buy them at the drugstore. * ACETAMINOPHEN - REGULAR STRENGTH TYLENOL: Take 650 mg (two 325 mg pills) by mouth every 4 to 6 hours as needed. Each Regular Strength Tylenol pill has 325 mg of acetaminophen. The most you should take is 10 pills a day (3,250 mg total). Note: In [place], the maximum is 12 pills a day (3,900 mg total). COLD PACK FOR EAR PAIN: * Apply a cold pack  or a cold wet washcloth to outer ear for 20 minutes to reduce pain while medicine takes effect. Note: Some adults prefer local heat for  20 minutes. * CAUTION: Hot or cold pack applied too long could cause burn or frostbite. CALL BACK IF: * Severe pain persists over 2 hours after pain medicine * You become worse CARE ADVICE given per Earache (Adult) guideline. Referrals GO TO FACILITY UNDECIDE

## 2021-01-29 NOTE — Assessment & Plan Note (Signed)
Anticipate viral head cold given current symptoms. I did recommend he check home COVID swab and let us know if positive as he'd be eligible for antiviral treatment.  Otherwise supportive measures at home reviewed.  Rx flonase, continue neti pot, and OTC remedies (avoiding decongestants).  Update if ongoing symptoms past 7-10 days to consider treatment for bacterial sinusitis.  If worsening ear symptoms, rec in-office evaluation.

## 2021-01-29 NOTE — Assessment & Plan Note (Addendum)
bp elevated in setting of acute illness and recent decongestant use. He will stop decongestant and monitor blood pressure readings at home. Continue metoprolol.

## 2021-01-29 NOTE — Progress Notes (Signed)
Patient ID: Micheal [name], male    DOB: 06-25-1990, 31 y.o.   MRN: [id number]  Virtual visit completed through [employer], a video enabled telemedicine application. Due to national recommendations of social distancing due to COVID-19, a virtual visit is felt to be most appropriate for this patient at this time. Reviewed limitations, risks, security and privacy concerns of performing a virtual visit and the availability of in person appointments. I also reviewed that there may be a patient responsible charge related to this service. The patient agreed to proceed.   Patient location: home Provider location: home Persons participating in this virtual visit: patient, provider   If any vitals were documented, they were collected by patient at home unless specified below.    BP (!) 150/89    Pulse 63    Temp (!) 97.4 F (36.3 C)    Ht 5' 9.75" (1.772 m)    Wt 290 lb (131.5 kg)    SpO2 98%    BMI 41.91 kg/m    CC: ST Subjective:   HPI: Micheal [name] is a 31 y.o. male presenting on 01/29/2021 for Sore Throat (C/o scratchy throat, stopped up ears and dry cough.  Sxs started 01/25/21.  Tried nasal and chest decongestant, gargling with warm salt water and Neti pot. )   4d h/o chest congestion, ST, progressing into head and ears with muffled hearing associated with earache and ringing in the evenings. Largely dry cough, occasionally productive of yellow mucous. Head > chest congestion. Sleeping well - cough not keeping him up.   Treating with zycam at home as well as OTC equate decongestant (acetaminophen, dextromethorphan, phenylephrine, guaifenesin).  Gargling with salt water, using neti pot, using ibuprofen.  No sick contacts at home. Both sisters recently with ear infections.   BP elevated today despite regularly taking his metoprolol 37.5mg  bid.   No dyspnea or wheezing, abd pain, nausea or diarrhea.  No h/o asthma. Non smoker.   Has not had COVID vaccines.  Has had flu shot.       Relevant past medical, surgical, family and social history reviewed and updated as indicated. Interim medical history since our last visit reviewed. Allergies and medications reviewed and updated. Outpatient Medications Prior to Visit  Medication Sig Dispense Refill   augmented betamethasone dipropionate (DIPROLENE-AF) 0.05 % cream APPLY TO AFFECTED AREA ON ELBOWS TWICE A DAY UNTIL CLEAR, THEN USE AS NEEDED     calcipotriene (DOVONOX) 0.005 % cream APPLY ON THE SKIN AS DIRECTED TWICE DAILY TO FACE AND FOLD OF SKIN UNTIL CLEAR THEN AS NEEDED     famotidine (PEPCID) 20 MG tablet Take 1 tablet (20 mg total) by mouth at bedtime.     ketoconazole (NIZORAL) 2 % shampoo APPLY TO AFFECTED AREA EVERY DAY     levocetirizine (XYZAL) 5 MG tablet Take 5 mg by mouth every evening.     levothyroxine (SYNTHROID) 50 MCG tablet TAKE 1 TABLET BY MOUTH EVERY DAY 30 tablet 6   metoprolol tartrate (LOPRESSOR) 25 MG tablet Take 1.5 tablets (37.5 mg total) by mouth 2 (two) times daily. 270 tablet 3   Multiple Vitamin (MULTIVITAMIN ADULT) TABS Take 1 tablet by mouth daily.     No facility-administered medications prior to visit.     Per HPI unless specifically indicated in ROS section below Review of Systems Objective:  BP (!) 150/89    Pulse 63    Temp (!) 97.4 F (36.3 C)    Ht 5' 9.75" (1.772  m)    Wt 290 lb (131.5 kg)    SpO2 98%    BMI 41.91 kg/m   Wt Readings from Last 3 Encounters:  01/29/21 290 lb (131.5 kg)  04/05/20 297 lb (134.7 kg)  01/03/20 294 lb 1 oz (133.4 kg)       Physical exam: Gen: alert, NAD, not ill appearing Pulm: speaks in complete sentences without increased work of breathing Psych: normal mood, normal thought content      Results for orders placed or performed in visit on 123456  Basic metabolic panel  Result Value Ref Range   Sodium 137 135 - 145 mEq/L   Potassium 4.6 3.5 - 5.1 mEq/L   Chloride 99 96 - 112 mEq/L   CO2 26 19 - 32 mEq/L   Glucose, Bld 77 70 - 99 mg/dL    BUN 12 6 - 23 mg/dL   Creatinine, Ser 0.92 0.40 - 1.50 mg/dL   GFR 110.33 >60.00 mL/min   Calcium 10.2 8.4 - 10.5 mg/dL  TSH  Result Value Ref Range   TSH 4.22 0.35 - 5.50 uIU/mL  CBC with Differential/Platelet  Result Value Ref Range   WBC 7.8 4.0 - 10.5 K/uL   RBC 5.27 4.22 - 5.81 Mil/uL   Hemoglobin 15.0 13.0 - 17.0 g/dL   HCT 44.4 39.0 - 52.0 %   MCV 84.2 78.0 - 100.0 fl   MCHC 33.8 30.0 - 36.0 g/dL   RDW 12.9 11.5 - 15.5 %   Platelets 223.0 150.0 - 400.0 K/uL   Neutrophils Relative % 44.3 43.0 - 77.0 %   Lymphocytes Relative 26.9 12.0 - 46.0 %   Monocytes Relative 6.3 3.0 - 12.0 %   Eosinophils Relative 21.8 (H) 0.0 - 5.0 %   Basophils Relative 0.7 0.0 - 3.0 %   Neutro Abs 3.5 1.4 - 7.7 K/uL   Lymphs Abs 2.1 0.7 - 4.0 K/uL   Monocytes Absolute 0.5 0.1 - 1.0 K/uL   Eosinophils Absolute 1.7 (H) 0.0 - 0.7 K/uL   Basophils Absolute 0.1 0.0 - 0.1 K/uL   Assessment & Plan:   Problem List Items Addressed This Visit     Essential hypertension    bp elevated in setting of acute illness and recent decongestant use. He will stop decongestant and monitor blood pressure readings at home. Continue metoprolol.       Acute respiratory infection - Primary    Anticipate viral head cold given current symptoms. I did recommend he check home COVID swab and let us know if positive as he'd be eligible for antiviral treatment.  Otherwise supportive measures at home reviewed.  Rx flonase, continue neti pot, and OTC remedies (avoiding decongestants).  Update if ongoing symptoms past 7-10 days to consider treatment for bacterial sinusitis.  If worsening ear symptoms, rec in-office evaluation.         Meds ordered this encounter  Medications   fluticasone (FLONASE) 50 MCG/ACT nasal spray    Sig: Place 2 sprays into both nostrils daily.    Dispense:  16 g    Refill:  1   No orders of the defined types were placed in this encounter.   I discussed the assessment and treatment plan  with the patient. The patient was provided an opportunity to ask questions and all were answered. The patient agreed with the plan and demonstrated an understanding of the instructions. The patient was advised to call back or seek an in-person evaluation if the symptoms worsen or if  the condition fails to improve as anticipated.  Follow up plan: No follow-ups on file.  Ria Bush, MD

## 2021-02-09 ENCOUNTER — Telehealth: Payer: Self-pay | Admitting: *Deleted

## 2021-02-09 ENCOUNTER — Other Ambulatory Visit: Payer: Self-pay

## 2021-02-09 ENCOUNTER — Ambulatory Visit (INDEPENDENT_AMBULATORY_CARE_PROVIDER_SITE_OTHER): Payer: Managed Care, Other (non HMO) | Admitting: Family Medicine

## 2021-02-09 VITALS — BP 152/88 | HR 85 | Temp 97.7°F | Ht 69.75 in | Wt 297.1 lb

## 2021-02-09 DIAGNOSIS — E039 Hypothyroidism, unspecified: Secondary | ICD-10-CM

## 2021-02-09 DIAGNOSIS — R002 Palpitations: Secondary | ICD-10-CM | POA: Diagnosis not present

## 2021-02-09 DIAGNOSIS — I10 Essential (primary) hypertension: Secondary | ICD-10-CM | POA: Diagnosis not present

## 2021-02-09 NOTE — Telephone Encounter (Addendum)
Noted. Appreciate Dr Ermalene Searing seeing this nice pt today.

## 2021-02-09 NOTE — Telephone Encounter (Signed)
PLEASE NOTE: All timestamps contained within this report are represented as [place] Standard Time. CONFIDENTIALTY NOTICE: This fax transmission is intended only for the addressee. It contains information that is legally privileged, confidential or otherwise protected from use or disclosure. If you are not the intended recipient, you are strictly prohibited from reviewing, disclosing, copying using or disseminating any of this information or taking any action in reliance on or regarding this information. If you have received this fax in error, please notify us immediately by telephone so that we can arrange for its return to [place]. Phone: [phone], Toll-Free: [phone], Fax: [phone] Page: 1 of 2 Call Id: [id number] [facility] - Client TELEPHONE ADVICE RECORD [facility] Patient Name: Micheal [name] Gender: Male DOB: 05-21-1990 Age: 38 Y 19 M 6 D Return Phone Number: [phone] (Primary), [phone] (Secondary) Address: [place]  [address] Client [facility] - Client Client Site [place] Provider Ria Bush - MD Contact Type Call Who Is Calling Patient / Member / Family / Caregiver Call Type Triage / Clinical Relationship To Patient Self Return Phone Number [phone] (Primary) Chief Complaint Heart palpitations or irregular heartbeat Reason for Call Symptomatic / Request for [facility] states his heart rate is going up is current reading is 84. He feels a heaviness on his chest, no pain just uneasiness. Blood pressure reading is fluxuating a lot as well. Translation No Nurse Assessment Nurse: Breeding, RN, [place] Date/Time (Eastern Time): 02/09/2021 11:33:59 AM Confirm and document reason for call. If symptomatic, describe symptoms. ---[name] states his heart rate is going up is current reading is 84. He feels a  heaviness on his chest, no pain just uneasiness. Blood pressure reading is fluctuating a lot as well. Current BP: 151/126, 70-80: Heart rate this morning. No SOB. [name] denies any other s/s at this time. Does the patient have any new or worsening symptoms? ---Yes Will a triage be completed? ---Yes Related visit to physician within the last 2 weeks? ---No Does the PT have any chronic conditions? (i.e. diabetes, asthma, this includes High risk factors for pregnancy, etc.) ---Yes List chronic conditions. ---Asthma, psoriasis, seasonal allergies, hypothyroidism, acid reflux Is this a behavioral health or substance abuse call? ---No Guidelines Guideline Title Affirmed Question Affirmed Notes Nurse Date/Time (Eastern Time) Blood Pressure - High [id number] Systolic BP >= [id number] OR Diastolic >= 80 AND [id number] taking BP medications Breeding, RN, [place] 02/09/2021 11:42:44 AM PLEASE NOTE: All timestamps contained within this report are represented as [place] Standard Time. CONFIDENTIALTY NOTICE: This fax transmission is intended only for the addressee. It contains information that is legally privileged, confidential or otherwise protected from use or disclosure. If you are not the intended recipient, you are strictly prohibited from reviewing, disclosing, copying using or disseminating any of this information or taking any action in reliance on or regarding this information. If you have received this fax in error, please notify us immediately by telephone so that we can arrange for its return to [place]. Phone: [phone], Toll-Free: [phone], Fax: [phone] Page: 2 of 2 Call Id: [id number] [place]. Time Eilene Ghazi Time) Disposition Final User 02/09/2021 11:51:35 AM See PCP within 2 Weeks Yes Breeding, RN, [place] Understands Yes PreDisposition Call Doctor Care Advice Given Per Guideline SEE PCP WITHIN 2 WEEKS: * You need to be seen for this ongoing problem  within the next 2 weeks. * PCP VISIT: Call your  doctor (or NP/PA) during regular office hours and make an appointment. REASSURANCE AND EDUCATION: * Your blood pressure is elevated but you have told me that you are not having any symptoms. * You should see your doctor and have your blood pressure checked within 2 weeks. * You might need to have an adjustment in your medication(s). HIGH BLOOD PRESSURE MEDICINES: * It is important to take prescribed medications as directed. HIGH BLOOD PRESSURE - LIFESTYLE MODIFICATIONS: * DECREASE SODIUM INTAKE: Aim to eat less than 2.4 g (100 mmol) of sodium each day. Unfortunately 75% of the salt in the average person's diet is in pre-processed foods. * EAT HEALTHY: Eat a diet rich in fresh fruits and vegetables, dietary fiber, non-animal protein (e.g., soy), and low-fat dairy products. Avoid foods with a high content of saturated fat or cholesterol. * LIMIT ALCOHOL: Limit alcoholic beverages to no more than one drink per day for women and no more than two drinks for men. A drink is 1.5 oz hard liquor (one shot or jigger; 45 ml), 5 oz wine (small glass; 150 ml), 12 oz beer (one can; 360 ml). CALL BACK IF: * Weakness or numbness of the face, arm or leg on one side of the body occurs * Difficulty walking, difficulty talking, or severe headache occurs * Chest pain or difficulty breathing occurs * Your blood pressure is over 160/100 * You become worse CARE ADVICE given per High Blood Pressure (Adult) guideline. Comments User: Jenny Reichmann, RN Date/Time (Eastern Time): 02/09/2021 11:45:36 AM New BP after having pt sit for a minute: 148/82 User: [place], [place], RN Date/Time (Eastern Time): 02/09/2021 11:52:20 AM [name] notes already having an appointment to be seen by a physician today @ 14:40. Referrals REFERRED TO PCP OFFICE

## 2021-02-09 NOTE — Progress Notes (Signed)
Patient ID: Micheal [name], male    DOB: 10/27/1990, 31 y.o.   MRN: [id number]  This visit was conducted in person.  BP (!) 152/88    Pulse 85    Temp 97.7 F (36.5 C) (Temporal)    Ht 5' 9.75" (1.772 m)    Wt 297 lb 2 oz (134.8 kg)    SpO2 99%    BMI 42.94 kg/m    CC:  Chief Complaint  Patient presents with   Hypertension    HR 127 during day yesterday, after an "uneasy" feeling.  145/80 last night after resting. Pt's smart watch did an ECG.  Burping a lot since last evening.     Subjective:   HPI: Micheal [name] is a 31 y.o. male  patient of Dr. Sharen Hones presenting on 02/09/2021 for Hypertension (HR 127 during day yesterday, after an "uneasy" feeling. /145/80 last night after resting./Pt's smart watch did an ECG. /Burping a lot since last evening. )   Was feeling well until last night.[name] as he was driving he felt uneasy feeling. Felt heart racing and pounding.  No SOB no CP, no lightheadedness. No chest tightness. HR 127 on Apple Watch.. EKG showed sinus.  Went to bed.. HR went down to 70s BP 145/80. BP Readings from Last 3 Encounters:  02/09/21 (!) 152/88  01/29/21 (!) 150/89  04/05/20 132/74   Reviewed lasts weeks virtual visit with PCP.   He is compliant with BBlocker.  Through the night HR was in 60s.  When woke up HR went during the day from 70-80s   He normally drinks a lot of water.  No big change in stress but he did recently get engaged.  Has been  He had some caffeine... had not had any in a while.  No ETOH. Took decongestant last week ( but Dr. Catalina Pizza him to stop) All viral URI symptoms resolved from last week.  He has history  low thyroid.[name] on levo 50 mcg daily.. has not missed.       Cardiac risk factors HTN Morbid obesity Body mass index is 42.94 kg/m. High cholesterol Hx of palpitations Non smoker  BP Readings from Last 3 Encounters:  02/09/21 (!) 152/88  01/29/21 (!) 150/89  04/05/20 132/74  Hypertension:   Previously well controlled  on metorpolol37.5 mg BID  Using medication without problems or lightheadedness:  Chest pain with exertion: Edema: Short of breath: Average home BPs: Other issues:   2021 ECHO EF 55-60%       Family history of CAD:   Relevant past medical, surgical, family and social history reviewed and updated as indicated. Interim medical history since our last visit reviewed. Allergies and medications reviewed and updated. Outpatient Medications Prior to Visit  Medication Sig Dispense Refill   augmented betamethasone dipropionate (DIPROLENE-AF) 0.05 % cream APPLY TO AFFECTED AREA ON ELBOWS TWICE A DAY UNTIL CLEAR, THEN USE AS NEEDED     calcipotriene (DOVONOX) 0.005 % cream APPLY ON THE SKIN AS DIRECTED TWICE DAILY TO FACE AND FOLD OF SKIN UNTIL CLEAR THEN AS NEEDED     famotidine (PEPCID) 20 MG tablet Take 1 tablet (20 mg total) by mouth at bedtime.     fluticasone (FLONASE) 50 MCG/ACT nasal spray Place 2 sprays into both nostrils daily. 16 g 1   ketoconazole (NIZORAL) 2 % shampoo APPLY TO AFFECTED AREA EVERY DAY     levocetirizine (XYZAL) 5 MG tablet Take 5 mg by mouth every evening.  levothyroxine (SYNTHROID) 50 MCG tablet TAKE 1 TABLET BY MOUTH EVERY DAY 30 tablet 6   metoprolol tartrate (LOPRESSOR) 25 MG tablet Take 1.5 tablets (37.5 mg total) by mouth 2 (two) times daily. 270 tablet 3   Multiple Vitamin (MULTIVITAMIN ADULT) TABS Take 1 tablet by mouth daily.     No facility-administered medications prior to visit.     Per HPI unless specifically indicated in ROS section below Review of Systems Objective:  BP (!) 152/88    Pulse 85    Temp 97.7 F (36.5 C) (Temporal)    Ht 5' 9.75" (1.772 m)    Wt 297 lb 2 oz (134.8 kg)    SpO2 99%    BMI 42.94 kg/m   Wt Readings from Last 3 Encounters:  02/09/21 297 lb 2 oz (134.8 kg)  01/29/21 290 lb (131.5 kg)  04/05/20 297 lb (134.7 kg)      Physical Exam    Results for orders placed or performed in visit on 09/06/20  Basic metabolic panel   Result Value Ref Range   Sodium 137 135 - 145 mEq/L   Potassium 4.6 3.5 - 5.1 mEq/L   Chloride 99 96 - 112 mEq/L   CO2 26 19 - 32 mEq/L   Glucose, Bld 77 70 - 99 mg/dL   BUN 12 6 - 23 mg/dL   Creatinine, Ser 3.55 0.40 - 1.50 mg/dL   GFR 732.20 >25.42 mL/min   Calcium 10.2 8.4 - 10.5 mg/dL  TSH  Result Value Ref Range   TSH 4.22 0.35 - 5.50 uIU/mL  CBC with Differential/Platelet  Result Value Ref Range   WBC 7.8 4.0 - 10.5 K/uL   RBC 5.27 4.22 - 5.81 Mil/uL   Hemoglobin 15.0 13.0 - 17.0 g/dL   HCT 70.6 23.7 - 62.8 %   MCV 84.2 78.0 - 100.0 fl   MCHC 33.8 30.0 - 36.0 g/dL   RDW 31.5 17.6 - 16.0 %   Platelets 223.0 150.0 - 400.0 K/uL   Neutrophils Relative % 44.3 43.0 - 77.0 %   Lymphocytes Relative 26.9 12.0 - 46.0 %   Monocytes Relative 6.3 3.0 - 12.0 %   Eosinophils Relative 21.8 (H) 0.0 - 5.0 %   Basophils Relative 0.7 0.0 - 3.0 %   Neutro Abs 3.5 1.4 - 7.7 K/uL   Lymphs Abs 2.1 0.7 - 4.0 K/uL   Monocytes Absolute 0.5 0.1 - 1.0 K/uL   Eosinophils Absolute 1.7 (H) 0.0 - 0.7 K/uL   Basophils Absolute 0.1 0.0 - 0.1 K/uL    This visit occurred during the SARS-CoV-2 public health emergency.  Safety protocols were in place, including screening questions prior to the visit, additional usage of staff PPE, and extensive cleaning of exam room while observing appropriate contact time as indicated for disinfecting solutions.   COVID 19 screen:  No recent travel or known exposure to COVID19 The patient denies respiratory symptoms of COVID 19 at this time. The importance of social distancing was discussed today.   Assessment and Plan EKG: normal EKG, normal sinus rhythm, unchanged from previous tracings.    Problem List Items Addressed This Visit     Essential hypertension    Decreased salt intake. Encouraged exercise, weight loss, healthy eating habits.  Increase metoprolol to 50 mg BID if labs are normal.      Relevant Orders   EKG 12-Lead (Completed)   Hypothyroidism    Relevant Orders   TSH   Palpitations - Primary  Likely multifactorial.. good stress, caffeine, recent decongestant etc.  EKG  Unremarkable. Will eval for  Additional secondary cause with labs.  Given recent elevated BP.. increase metoprolol to 50 mg BID.  Close follow up with PCP.      Relevant Orders   Comprehensive metabolic panel   TSH   CBC with Differential/Platelet   EKG 12-Lead (Completed)     Kerby NoraAmy Doralene Glanz, MD

## 2021-02-09 NOTE — Assessment & Plan Note (Signed)
Likely multifactorial.. good stress, caffeine, recent decongestant etc.  EKG  Unremarkable. Will eval for  Additional secondary cause with labs.  Given recent elevated BP.. increase metoprolol to 50 mg BID.  Close follow up with PCP.

## 2021-02-09 NOTE — Patient Instructions (Addendum)
No caffeine. No decongestants Keep up with water intake.  Work on stress reduction and relation.   Please stop at the lab to have labs drawn.  If labs are normal,  increase metoprolol to 2 tabs twice daily and follow up in 2 weeks with PCP.

## 2021-02-09 NOTE — Telephone Encounter (Signed)
Patient scheduled to see Dr. Ermalene Searing today 02/09/21 at 2:40 pm.

## 2021-02-09 NOTE — Assessment & Plan Note (Signed)
Decreased salt intake. Encouraged exercise, weight loss, healthy eating habits.  Increase metoprolol to 50 mg BID if labs are normal.

## 2021-02-10 LAB — CBC WITH DIFFERENTIAL/PLATELET
Absolute Monocytes: 607 cells/uL (ref 200–950)
Basophils Absolute: 64 cells/uL (ref 0–200)
Basophils Relative: 0.7 %
Eosinophils Absolute: 1279 cells/uL — ABNORMAL HIGH (ref 15–500)
Eosinophils Relative: 13.9 %
HCT: 44 % (ref 38.5–50.0)
Hemoglobin: 14.7 g/dL (ref 13.2–17.1)
Lymphs Abs: 1426 cells/uL (ref 850–3900)
MCH: 28.2 pg (ref 27.0–33.0)
MCHC: 33.4 g/dL (ref 32.0–36.0)
MCV: 84.5 fL (ref 80.0–100.0)
MPV: 10.6 fL (ref 7.5–12.5)
Monocytes Relative: 6.6 %
Neutro Abs: 5824 cells/uL (ref 1500–7800)
Neutrophils Relative %: 63.3 %
Platelets: 287 10*3/uL (ref 140–400)
RBC: 5.21 10*6/uL (ref 4.20–5.80)
RDW: 12.3 % (ref 11.0–15.0)
Total Lymphocyte: 15.5 %
WBC: 9.2 10*3/uL (ref 3.8–10.8)

## 2021-02-10 LAB — COMPREHENSIVE METABOLIC PANEL
AG Ratio: 1.6 (calc) (ref 1.0–2.5)
ALT: 55 U/L — ABNORMAL HIGH (ref 9–46)
AST: 29 U/L (ref 10–40)
Albumin: 5 g/dL (ref 3.6–5.1)
Alkaline phosphatase (APISO): 80 U/L (ref 36–130)
BUN: 12 mg/dL (ref 7–25)
CO2: 31 mmol/L (ref 20–32)
Calcium: 10.6 mg/dL — ABNORMAL HIGH (ref 8.6–10.3)
Chloride: 101 mmol/L (ref 98–110)
Creat: 0.94 mg/dL (ref 0.60–1.26)
Globulin: 3.2 g/dL (calc) (ref 1.9–3.7)
Glucose, Bld: 92 mg/dL (ref 65–99)
Potassium: 4.8 mmol/L (ref 3.5–5.3)
Sodium: 139 mmol/L (ref 135–146)
Total Bilirubin: 0.5 mg/dL (ref 0.2–1.2)
Total Protein: 8.2 g/dL — ABNORMAL HIGH (ref 6.1–8.1)

## 2021-02-10 LAB — TSH: TSH: 2.18 mIU/L (ref 0.40–4.50)

## 2021-02-12 ENCOUNTER — Telehealth: Payer: Self-pay | Admitting: Internal Medicine

## 2021-02-12 NOTE — Telephone Encounter (Signed)
Patient last seen 06/03/2016.  Patient stated that his current machine is not working. I have provided him with [employer] contact number and suggested that he contact them. Scheduled consult with Dr. Belia Heman 04/18/2020 at 9:00. Nothing further needed at this time.

## 2021-02-20 ENCOUNTER — Other Ambulatory Visit: Payer: Self-pay | Admitting: Family Medicine

## 2021-03-06 ENCOUNTER — Ambulatory Visit: Payer: Managed Care, Other (non HMO) | Admitting: Family Medicine

## 2021-03-08 ENCOUNTER — Encounter: Payer: Self-pay | Admitting: Family Medicine

## 2021-03-08 ENCOUNTER — Other Ambulatory Visit: Payer: Self-pay

## 2021-03-08 ENCOUNTER — Ambulatory Visit: Payer: Managed Care, Other (non HMO) | Admitting: Family Medicine

## 2021-03-08 VITALS — BP 146/86 | HR 81 | Temp 97.5°F | Ht 69.75 in | Wt 291.2 lb

## 2021-03-08 DIAGNOSIS — I10 Essential (primary) hypertension: Secondary | ICD-10-CM

## 2021-03-08 DIAGNOSIS — R002 Palpitations: Secondary | ICD-10-CM | POA: Diagnosis not present

## 2021-03-08 DIAGNOSIS — K219 Gastro-esophageal reflux disease without esophagitis: Secondary | ICD-10-CM

## 2021-03-08 DIAGNOSIS — F419 Anxiety disorder, unspecified: Secondary | ICD-10-CM | POA: Diagnosis not present

## 2021-03-08 DIAGNOSIS — R229 Localized swelling, mass and lump, unspecified: Secondary | ICD-10-CM

## 2021-03-08 DIAGNOSIS — E039 Hypothyroidism, unspecified: Secondary | ICD-10-CM

## 2021-03-08 MED ORDER — HYDROXYZINE HCL 25 MG PO TABS: 12.5000 mg | ORAL_TABLET | Freq: Two times a day (BID) | ORAL | 3 refills | Status: DC | PRN

## 2021-03-08 MED ORDER — LEVOTHYROXINE SODIUM 50 MCG PO TABS: 50.0000 ug | ORAL_TABLET | Freq: Every day | ORAL | 1 refills | Status: DC

## 2021-03-08 MED ORDER — METOPROLOL TARTRATE 50 MG PO TABS: 50.0000 mg | ORAL_TABLET | Freq: Two times a day (BID) | ORAL | 1 refills | Status: DC

## 2021-03-08 NOTE — Assessment & Plan Note (Signed)
Stable period on pepcid 20mg  nightly.

## 2021-03-08 NOTE — Patient Instructions (Addendum)
Start monitoring blood pressures at home. Update me with blood pressure readings.  Try hydroxyzine 25mg  1/2-1 tablet as needed for anxiety.  For spot on leg - warm compresses throughout the day, let know if enlarging or becoming more tender or bothersome.

## 2021-03-08 NOTE — Assessment & Plan Note (Signed)
History of this, anticipate driving palpitation feeling.  Discussed PRN medication options of benzo vs hydroxyzine - doesn't need daily med at this time.  Continue BB, add PRN hydroxyzine. Reviewed side effects to watch for. Reassess at 1 mo f/u visit.

## 2021-03-08 NOTE — Assessment & Plan Note (Signed)
To left proximal inner thigh - anticipate ingrown hair without current abscess. rec warm compresses. Update if progressive.

## 2021-03-08 NOTE — Assessment & Plan Note (Signed)
Anticipate anxiety related - see below.

## 2021-03-08 NOTE — Assessment & Plan Note (Signed)
Stable period on levothyroxine daily - continue.

## 2021-03-08 NOTE — Assessment & Plan Note (Signed)
Chronic, elevated despite metoprolol 50mg  bid.  He's already limited caffeine.  rec change BB dosing to 8am/8pm, start monitoring BP at home and bring BP log to next OV in 1 month.

## 2021-03-08 NOTE — Progress Notes (Addendum)
Patient ID: Micheal [name], male    DOB: 1990/05/06, 31 y.o.   MRN: [id number]  This visit was conducted in person.  BP (!) 146/86 (BP Location: Right Arm, Cuff Size: Large)    Pulse 81    Temp (!) 97.5 F (36.4 C) (Temporal)    Ht 5' 9.75" (1.772 m)    Wt 291 lb 4 oz (132.1 kg)    SpO2 98%    BMI 42.09 kg/m   BP Readings from Last 3 Encounters:  03/08/21 (!) 146/86  02/09/21 (!) 152/88  01/29/21 (!) 150/89    CC: 3 wk f/u Subjective:   HPI: Micheal [name] is a 31 y.o. male presenting on 03/08/2021 for Hypertension (Here for 2 wk f/u.)   Seen here last month by Dr Ermalene Searing after episode of palpations with HR to 127 via apple watch. This in setting of recent caffeine intake and decongestant use. EKG at that time normal, metoprolol increased to 50mg  bid. Home BP readings not recently checked. He's been taking BB 11am and 11pm.   Labs returned stable as per below (mildly elevated Ca and ALT and prot, elevated eosinophils).   Overall has been feeling ok, did have episode last night of anxiety associated with racing heart to 110s, increased GI upset/burping. No indigestion, GERD. Notes he tends to over worry.   No dyspnea, chest pain, headache or dizziness.  Recently engaged, planning marriage Nov 4th.   Recently started probiotic 01/2021.  He continues pepcid nightly.   OSA on CPAP - upcoming appt 04/2021 with pulm.   New spot on left leg he wants checked out - ?blood blister. Tender to touch. No streaking redness, no fever. No other skin spots.      Relevant past medical, surgical, family and social history reviewed and updated as indicated. Interim medical history since our last visit reviewed. Allergies and medications reviewed and updated. Outpatient Medications Prior to Visit  Medication Sig Dispense Refill   augmented betamethasone dipropionate (DIPROLENE-AF) 0.05 % cream APPLY TO AFFECTED AREA ON ELBOWS TWICE A DAY UNTIL CLEAR, THEN USE AS NEEDED     calcipotriene (DOVONOX)  0.005 % cream APPLY ON THE SKIN AS DIRECTED TWICE DAILY TO FACE AND FOLD OF SKIN UNTIL CLEAR THEN AS NEEDED     famotidine (PEPCID) 20 MG tablet Take 1 tablet (20 mg total) by mouth at bedtime.     fluticasone (FLONASE) 50 MCG/ACT nasal spray SPRAY 2 SPRAYS INTO EACH NOSTRIL EVERY DAY 16 mL 1   ketoconazole (NIZORAL) 2 % shampoo APPLY TO AFFECTED AREA EVERY DAY     levocetirizine (XYZAL) 5 MG tablet Take 5 mg by mouth every evening.     Multiple Vitamin (MULTIVITAMIN ADULT) TABS Take 1 tablet by mouth daily.     Probiotic Product (PROBIOTIC PO) Take 2 tablets by mouth daily before breakfast.     levothyroxine (SYNTHROID) 50 MCG tablet TAKE 1 TABLET BY MOUTH EVERY DAY 30 tablet 6   metoprolol tartrate (LOPRESSOR) 25 MG tablet Take 1.5 tablets (37.5 mg total) by mouth 2 (two) times daily. (Patient taking differently: Take 37.5 mg by mouth 2 (two) times daily. Takes 2 tablets twice a day) 270 tablet 3   No facility-administered medications prior to visit.     Per HPI unless specifically indicated in ROS section below Review of Systems  Objective:  BP (!) 146/86 (BP Location: Right Arm, Cuff Size: Large)    Pulse 81    Temp (!) 97.5 F (  36.4 C) (Temporal)    Ht 5' 9.75" (1.772 m)    Wt 291 lb 4 oz (132.1 kg)    SpO2 98%    BMI 42.09 kg/m   Wt Readings from Last 3 Encounters:  03/08/21 291 lb 4 oz (132.1 kg)  02/09/21 297 lb 2 oz (134.8 kg)  01/29/21 290 lb (131.5 kg)      Physical Exam Vitals and nursing note reviewed.  Constitutional:      Appearance: Normal appearance. He is not ill-appearing.  Eyes:     Extraocular Movements: Extraocular movements intact.     Conjunctiva/sclera: Conjunctivae normal.     Pupils: Pupils are equal, round, and reactive to light.  Neck:     Thyroid: No thyroid mass or thyromegaly.  Cardiovascular:     Rate and Rhythm: Normal rate and regular rhythm.     Pulses: Normal pulses.     Heart sounds: Normal heart sounds. No murmur heard. Pulmonary:      Effort: Pulmonary effort is normal. No respiratory distress.     Breath sounds: Normal breath sounds. No wheezing, rhonchi or rales.  Abdominal:     General: Bowel sounds are normal. There is no distension.     Palpations: Abdomen is soft. There is no mass.     Tenderness: There is no abdominal tenderness. There is no guarding or rebound.     Hernia: No hernia is present.  Musculoskeletal:     Right lower leg: No edema.     Left lower leg: No edema.  Skin:    General: Skin is warm and dry.     Findings: Bruising (L inner thigh) present. No rash.          Comments: Bruising to L proximal inner thigh with palpable small subcm scarred nodule without induration or erythema  Neurological:     Mental Status: He is alert.  Psychiatric:        Mood and Affect: Mood normal.        Behavior: Behavior normal.      Results for orders placed or performed in visit on 02/09/21  Comprehensive metabolic panel  Result Value Ref Range   Glucose, Bld 92 65 - 99 mg/dL   BUN 12 7 - 25 mg/dL   Creat 3.41 9.37 - 9.02 mg/dL   BUN/Creatinine Ratio NOT APPLICABLE 6 - 22 (calc)   Sodium 139 135 - 146 mmol/L   Potassium 4.8 3.5 - 5.3 mmol/L   Chloride 101 98 - 110 mmol/L   CO2 31 20 - 32 mmol/L   Calcium 10.6 (H) 8.6 - 10.3 mg/dL   Total Protein 8.2 (H) 6.1 - 8.1 g/dL   Albumin 5.0 3.6 - 5.1 g/dL   Globulin 3.2 1.9 - 3.7 g/dL (calc)   AG Ratio 1.6 1.0 - 2.5 (calc)   Total Bilirubin 0.5 0.2 - 1.2 mg/dL   Alkaline phosphatase (APISO) 80 36 - 130 U/L   AST 29 10 - 40 U/L   ALT 55 (H) 9 - 46 U/L  TSH  Result Value Ref Range   TSH 2.18 0.40 - 4.50 mIU/L  CBC with Differential/Platelet  Result Value Ref Range   WBC 9.2 3.8 - 10.8 Thousand/uL   RBC 5.21 4.20 - 5.80 Million/uL   Hemoglobin 14.7 13.2 - 17.1 g/dL   HCT 40.9 73.5 - 32.9 %   MCV 84.5 80.0 - 100.0 fL   MCH 28.2 27.0 - 33.0 pg   MCHC 33.4 32.0 - 36.0 g/dL  RDW 12.3 11.0 - 15.0 %   Platelets 287 140 - 400 Thousand/uL   MPV 10.6 7.5 -  12.5 fL   Neutro Abs 5,824 1,500 - 7,800 cells/uL   Lymphs Abs 1,426 850 - 3,900 cells/uL   Absolute Monocytes 607 200 - 950 cells/uL   Eosinophils Absolute 1,279 (H) 15 - 500 cells/uL   Basophils Absolute 64 0 - 200 cells/uL   Neutrophils Relative % 63.3 %   Total Lymphocyte 15.5 %   Monocytes Relative 6.6 %   Eosinophils Relative 13.9 %   Basophils Relative 0.7 %    Assessment & Plan:  This visit occurred during the SARS-CoV-2 public health emergency.  Safety protocols were in place, including screening questions prior to the visit, additional usage of staff PPE, and extensive cleaning of exam room while observing appropriate contact time as indicated for disinfecting solutions.   Problem List Items Addressed This Visit     Essential hypertension    Chronic, elevated despite metoprolol 50mg  bid.  He's already limited caffeine.  rec change BB dosing to 8am/8pm, start monitoring BP at home and bring BP log to next OV in 1 month.       Relevant Medications   metoprolol tartrate (LOPRESSOR) 50 MG tablet   GERD (gastroesophageal reflux disease)    Stable period on pepcid 20mg  nightly.       Relevant Medications   Probiotic Product (PROBIOTIC PO)   Palpitations    Anticipate anxiety related - see below.       Hypothyroidism    Stable period on levothyroxine daily - continue.       Relevant Medications   metoprolol tartrate (LOPRESSOR) 50 MG tablet   levothyroxine (SYNTHROID) 50 MCG tablet   Skin nodule    To left proximal inner thigh - anticipate ingrown hair without current abscess. rec warm compresses. Update if progressive.       Anxiety - Primary    History of this, anticipate driving palpitation feeling.  Discussed PRN medication options of benzo vs hydroxyzine - doesn't need daily med at this time.  Continue BB, add PRN hydroxyzine. Reviewed side effects to watch for. Reassess at 1 mo f/u visit.       Relevant Medications   hydrOXYzine (ATARAX) 25 MG  tablet     Meds ordered this encounter  Medications   hydrOXYzine (ATARAX) 25 MG tablet    Sig: Take 0.5-1 tablets (12.5-25 mg total) by mouth 2 (two) times daily as needed for anxiety.    Dispense:  30 tablet    Refill:  3   metoprolol tartrate (LOPRESSOR) 50 MG tablet    Sig: Take 1 tablet (50 mg total) by mouth 2 (two) times daily.    Dispense:  180 tablet    Refill:  1    Note new dose   levothyroxine (SYNTHROID) 50 MCG tablet    Sig: Take 1 tablet (50 mcg total) by mouth daily.    Dispense:  90 tablet    Refill:  1   No orders of the defined types were placed in this encounter.    Patient Instructions  Start monitoring blood pressures at home. Update me with blood pressure readings.  Try hydroxyzine 25mg  1/2-1 tablet as needed for anxiety.  For spot on leg - warm compresses throughout the day, let know if enlarging or becoming more tender or bothersome.   Follow up plan: No follow-ups on file.  02-14-1984, MD

## 2021-03-20 ENCOUNTER — Other Ambulatory Visit: Payer: Self-pay | Admitting: Family Medicine

## 2021-03-21 NOTE — Telephone Encounter (Signed)
ERx ?Recommend one month at a time as this is a PRN medication.  ?

## 2021-03-21 NOTE — Telephone Encounter (Signed)
Refill request Atarax ?Last refill 03/08/21 #30/3 ?Last office visit 03/08/21 ?

## 2021-03-22 NOTE — Telephone Encounter (Signed)
Left message on vm per dpr relaying Dr. G's message.  

## 2021-03-29 ENCOUNTER — Other Ambulatory Visit: Payer: Self-pay | Admitting: Family Medicine

## 2021-04-02 ENCOUNTER — Ambulatory Visit: Payer: Managed Care, Other (non HMO) | Admitting: Family Medicine

## 2021-04-02 ENCOUNTER — Other Ambulatory Visit: Payer: Self-pay

## 2021-04-02 ENCOUNTER — Encounter: Payer: Self-pay | Admitting: Family Medicine

## 2021-04-02 VITALS — BP 134/82 | HR 73 | Temp 97.3°F | Ht 69.75 in | Wt 293.2 lb

## 2021-04-02 DIAGNOSIS — F419 Anxiety disorder, unspecified: Secondary | ICD-10-CM | POA: Diagnosis not present

## 2021-04-02 DIAGNOSIS — R002 Palpitations: Secondary | ICD-10-CM | POA: Diagnosis not present

## 2021-04-02 DIAGNOSIS — I10 Essential (primary) hypertension: Secondary | ICD-10-CM | POA: Diagnosis not present

## 2021-04-02 NOTE — Assessment & Plan Note (Signed)
Stable period on PRN hydroxyzine - hasn't needed to use.  ?

## 2021-04-02 NOTE — Assessment & Plan Note (Signed)
Doing well on current BB dose - without recent palpitations. ?

## 2021-04-02 NOTE — Patient Instructions (Signed)
You are doing well today ?Continue current medicines ?Return for physical in June ?

## 2021-04-02 NOTE — Assessment & Plan Note (Signed)
Chronic, improved on current dosing schedule - continue.  ?

## 2021-04-02 NOTE — Progress Notes (Signed)
? ? Patient ID: Micheal [name], male    DOB: Dec 17, 1990, 31 y.o.   MRN: [id number] ? ?This visit was conducted in person. ? ?BP 134/82   Pulse 73   Temp (!) 97.3 ?F (36.3 ?C) (Temporal)   Ht 5' 9.75" (1.772 m)   Wt 293 lb 4 oz (133 kg)   SpO2 98%   BMI 42.38 kg/m?   ? ?CC: follow up visit  ?Subjective:  ? ?HPI: ?Micheal [name] is a 31 y.o. male presenting on 04/02/2021 for Hypothyroidism (Here for f/u.) ? ? ?See prior note for details.  ?HTN -better controlled on metoprolol 50mg  BID (8am, 8pm). Shares BP log - 120-140/80s, HR 60s.  ?No HA, vision changes, CP/tightness, SOB, leg swelling.  ? ?Anxiety - continues BB and prn hydroxyzine with benefit - actually hasn't needed hydroxyzine.  ?   ? ?Relevant past medical, surgical, family and social history reviewed and updated as indicated. Interim medical history since our last visit reviewed. ?Allergies and medications reviewed and updated. ?Outpatient Medications Prior to Visit  ?Medication Sig Dispense Refill  ? augmented betamethasone dipropionate (DIPROLENE-AF) 0.05 % cream APPLY TO AFFECTED AREA ON ELBOWS TWICE A DAY UNTIL CLEAR, THEN USE AS NEEDED    ? calcipotriene (DOVONOX) 0.005 % cream APPLY ON THE SKIN AS DIRECTED TWICE DAILY TO FACE AND FOLD OF SKIN UNTIL CLEAR THEN AS NEEDED    ? famotidine (PEPCID) 20 MG tablet Take 1 tablet (20 mg total) by mouth at bedtime.    ? fluticasone (FLONASE) 50 MCG/ACT nasal spray SPRAY 2 SPRAYS INTO EACH NOSTRIL EVERY DAY 16 mL 2  ? hydrOXYzine (ATARAX) 25 MG tablet TAKE 0.5-1 TABLETS (12.5-25 MG TOTAL) BY MOUTH 2 (TWO) TIMES DAILY AS NEEDED FOR ANXIETY. 180 tablet 2  ? ketoconazole (NIZORAL) 2 % shampoo APPLY TO AFFECTED AREA EVERY DAY    ? levocetirizine (XYZAL) 5 MG tablet Take 5 mg by mouth every evening.    ? levothyroxine (SYNTHROID) 50 MCG tablet Take 1 tablet (50 mcg total) by mouth daily. 90 tablet 1  ? metoprolol tartrate (LOPRESSOR) 50 MG tablet Take 1 tablet (50 mg total) by mouth 2 (two) times daily. 180 tablet  1  ? Multiple Vitamin (MULTIVITAMIN ADULT) TABS Take 1 tablet by mouth daily.    ? Probiotic Product (PROBIOTIC PO) Take 2 tablets by mouth daily before breakfast.    ? ?No facility-administered medications prior to visit.  ?  ? ?Per HPI unless specifically indicated in ROS section below ?Review of Systems ? ?Objective:  ?BP 134/82   Pulse 73   Temp (!) 97.3 ?F (36.3 ?C) (Temporal)   Ht 5' 9.75" (1.772 m)   Wt 293 lb 4 oz (133 kg)   SpO2 98%   BMI 42.38 kg/m?   ?Wt Readings from Last 3 Encounters:  ?04/02/21 293 lb 4 oz (133 kg)  ?03/08/21 291 lb 4 oz (132.1 kg)  ?02/09/21 297 lb 2 oz (134.8 kg)  ?  ?  ?Physical Exam ?Vitals and nursing note reviewed.  ?Constitutional:   ?   Appearance: Normal appearance. He is not ill-appearing.  ?Eyes:  ?   Extraocular Movements: Extraocular movements intact.  ?   Pupils: Pupils are equal, round, and reactive to light.  ?Neck:  ?   Thyroid: No thyroid mass or thyromegaly.  ?Cardiovascular:  ?   Rate and Rhythm: Normal rate and regular rhythm.  ?   Pulses: Normal pulses.  ?   Heart sounds: Normal heart sounds.  No murmur heard. ?Pulmonary:  ?   Effort: Pulmonary effort is normal. No respiratory distress.  ?   Breath sounds: Normal breath sounds. No wheezing, rhonchi or rales.  ?Musculoskeletal:  ?   Cervical back: Normal range of motion and neck supple. No rigidity.  ?   Right lower leg: No edema.  ?   Left lower leg: No edema.  ?Lymphadenopathy:  ?   Cervical: No cervical adenopathy.  ?Skin: ?   General: Skin is warm and dry.  ?   Findings: No rash.  ?Neurological:  ?   Mental Status: He is alert.  ?Psychiatric:     ?   Mood and Affect: Mood normal.     ?   Behavior: Behavior normal.  ? ?   ?Results for orders placed or performed in visit on 02/09/21  ?Comprehensive metabolic panel  ?Result Value Ref Range  ? Glucose, Bld 92 65 - 99 mg/dL  ? BUN 12 7 - 25 mg/dL  ? Creat 0.94 0.60 - 1.26 mg/dL  ? BUN/Creatinine Ratio NOT APPLICABLE 6 - 22 (calc)  ? Sodium 139 135 - 146 mmol/L   ? Potassium 4.8 3.5 - 5.3 mmol/L  ? Chloride 101 98 - 110 mmol/L  ? CO2 31 20 - 32 mmol/L  ? Calcium 10.6 (H) 8.6 - 10.3 mg/dL  ? Total Protein 8.2 (H) 6.1 - 8.1 g/dL  ? Albumin 5.0 3.6 - 5.1 g/dL  ? Globulin 3.2 1.9 - 3.7 g/dL (calc)  ? AG Ratio 1.6 1.0 - 2.5 (calc)  ? Total Bilirubin 0.5 0.2 - 1.2 mg/dL  ? Alkaline phosphatase (APISO) 80 36 - 130 U/L  ? AST 29 10 - 40 U/L  ? ALT 55 (H) 9 - 46 U/L  ?TSH  ?Result Value Ref Range  ? TSH 2.18 0.40 - 4.50 mIU/L  ?CBC with Differential/Platelet  ?Result Value Ref Range  ? WBC 9.2 3.8 - 10.8 Thousand/uL  ? RBC 5.21 4.20 - 5.80 Million/uL  ? Hemoglobin 14.7 13.2 - 17.1 g/dL  ? HCT 44.0 38.5 - 50.0 %  ? MCV 84.5 80.0 - 100.0 fL  ? MCH 28.2 27.0 - 33.0 pg  ? MCHC 33.4 32.0 - 36.0 g/dL  ? RDW 12.3 11.0 - 15.0 %  ? Platelets 287 140 - 400 Thousand/uL  ? MPV 10.6 7.5 - 12.5 fL  ? Neutro Abs 5,824 1,500 - 7,800 cells/uL  ? Lymphs Abs 1,426 850 - 3,900 cells/uL  ? Absolute Monocytes 607 200 - 950 cells/uL  ? Eosinophils Absolute 1,279 (H) 15 - 500 cells/uL  ? Basophils Absolute 64 0 - 200 cells/uL  ? Neutrophils Relative % 63.3 %  ? Total Lymphocyte 15.5 %  ? Monocytes Relative 6.6 %  ? Eosinophils Relative 13.9 %  ? Basophils Relative 0.7 %  ? ?Depression screen [facility] 2/9 04/02/2021 04/05/2020 03/12/2019  ?Decreased Interest 0 0 0  ?Down, Depressed, Hopeless 0 0 0  ?PHQ - 2 Score 0 0 0  ?Altered sleeping 0 0 0  ?Tired, decreased energy 1 0 0  ?Change in appetite 0 0 2  ?Feeling bad or failure about yourself  0 0 0  ?Trouble concentrating 0 0 0  ?Moving slowly or fidgety/restless 0 0 0  ?Suicidal thoughts 0 0 0  ?PHQ-9 Score 1 0 2  ?Difficult doing work/chores - Not difficult at all -  ?  ?GAD 7 : Generalized Anxiety Score 04/02/2021 03/12/2019  ?Nervous, Anxious, on Edge 0 1  ?Control/stop worrying  0 0  ?Worry too much - different things 1 1  ?Trouble relaxing 0 0  ?Restless 0 0  ?Easily annoyed or irritable 0 0  ?Afraid - awful might happen 1 1  ?Total GAD 7 Score 2 3  ? ?Assessment  & Plan:  ?This visit occurred during the SARS-CoV-2 public health emergency.  Safety protocols were in place, including screening questions prior to the visit, additional usage of staff PPE, and extensive cleaning of exam room while observing appropriate contact time as indicated for disinfecting solutions.  ? ?Problem List Items Addressed This Visit   ? ? Essential hypertension - Primary  ?  Chronic, improved on current dosing schedule - continue.  ?  ?  ? Palpitations  ?  Doing well on current BB dose - without recent palpitations. ?  ?  ? Anxiety  ?  Stable period on PRN hydroxyzine - hasn't needed to use.  ?  ?  ?  ? ?No orders of the defined types were placed in this encounter. ? ?No orders of the defined types were placed in this encounter. ? ? ? ?Patient Instructions  ?You are doing well today ?Continue current medicines ?Return for physical in June ? ?Follow up plan: ?Return if symptoms worsen or fail to improve. ? ?Eustaquio BoydenJavier Jamell Laymon, MD   ?

## 2021-04-18 ENCOUNTER — Ambulatory Visit: Payer: Managed Care, Other (non HMO) | Admitting: Internal Medicine

## 2021-04-18 ENCOUNTER — Encounter: Payer: Self-pay | Admitting: Internal Medicine

## 2021-04-18 VITALS — BP 126/80 | HR 66 | Temp 97.6°F | Ht 68.5 in | Wt 293.6 lb

## 2021-04-18 DIAGNOSIS — G4733 Obstructive sleep apnea (adult) (pediatric): Secondary | ICD-10-CM

## 2021-04-18 NOTE — Patient Instructions (Addendum)
Continue auto CPAP as prescribed will change to AUTO CPAP 5-12 cmH20 ? ?Re-establish with DME company ? ?Keep up the great work!! A+ ?

## 2021-04-18 NOTE — Progress Notes (Signed)
?[facility] Pulmonary Medicine Consultation   ? ? ? ?Date: 04/18/2021,   ?MRN# [id number] Micheal [name] 1990-06-01 ? ?   ?Micheal [name] is a 31 y.o. old male seen in consultation for problems snoring at the request of Dr. Brandon Melnick ? ? ? ? ?CHIEF COMPLAINT:  ? ?Follow up [facility] ? ? ?HISTORY OF PRESENT ILLNESS  ? ?Patient previously diagnosed with sleep apnea  ?Previously seen in 2018  ?Had had excellent compliance report with AHI of 4  ? ?Patient was diagnosed with AHI of 92  ?Severe sleep apnea  ?Had been on auto CPAP 5 to 20 cm of water pressure back in 2018  ? ? ? ?No exacerbation at this time ?No evidence of heart failure at this time ?No evidence or signs of infection at this time ?No respiratory distress ?No fevers, chills, nausea, vomiting, diarrhea ?No evidence of lower extremity edema ?No evidence hemoptysis ? ?Current CPAP download April 2023 ?Excellent compliance 100% for days 100% for greater than 4 hours AHI reduced to 2.5 ?Auto CPAP 5 to 15 cm of water pressure ? ?Current weight 293 pounds ?Remained consistent over the last 5 years ?Patient current DME company went out of business would like to reestablish with a new 1 ?At times pressure may be too much will consider decreasing auto CPAP 5 to 12 cm of water pressure ? ? ? ? ?Current CPAP download ? ?Current Medication: ? ?Current Outpatient Medications:  ??  augmented betamethasone dipropionate (DIPROLENE-AF) 0.05 % cream, APPLY TO AFFECTED AREA ON ELBOWS TWICE A DAY UNTIL CLEAR, THEN USE AS NEEDED, Disp: , Rfl:  ??  calcipotriene (DOVONOX) 0.005 % cream, APPLY ON THE SKIN AS DIRECTED TWICE DAILY TO FACE AND FOLD OF SKIN UNTIL CLEAR THEN AS NEEDED, Disp: , Rfl:  ??  famotidine (PEPCID) 20 MG tablet, Take 1 tablet (20 mg total) by mouth at bedtime., Disp: , Rfl:  ??  fluticasone (FLONASE) 50 MCG/ACT nasal spray, SPRAY 2 SPRAYS INTO EACH NOSTRIL EVERY DAY, Disp: 16 mL, Rfl: 2 ??  hydrOXYzine (ATARAX) 25 MG tablet, TAKE 0.5-1 TABLETS (12.5-25 MG TOTAL) BY  MOUTH 2 (TWO) TIMES DAILY AS NEEDED FOR ANXIETY., Disp: 180 tablet, Rfl: 2 ??  ketoconazole (NIZORAL) 2 % shampoo, APPLY TO AFFECTED AREA EVERY DAY, Disp: , Rfl:  ??  levocetirizine (XYZAL) 5 MG tablet, Take 5 mg by mouth every evening., Disp: , Rfl:  ??  levothyroxine (SYNTHROID) 50 MCG tablet, Take 1 tablet (50 mcg total) by mouth daily., Disp: 90 tablet, Rfl: 1 ??  metoprolol tartrate (LOPRESSOR) 50 MG tablet, Take 1 tablet (50 mg total) by mouth 2 (two) times daily., Disp: 180 tablet, Rfl: 1 ??  Multiple Vitamin (MULTIVITAMIN ADULT) TABS, Take 1 tablet by mouth daily., Disp: , Rfl:  ??  Probiotic Product (PROBIOTIC PO), Take 2 tablets by mouth daily before breakfast., Disp: , Rfl:  ? ? ? ?ALLERGIES  ? ?Patient has no known allergies. ? ? ? ? ?REVIEW OF SYSTEMS  ? ?Review of Systems  ?Constitutional:  Negative for chills, diaphoresis, fever, malaise/fatigue and weight loss.  ?Respiratory:  Negative for cough, hemoptysis, sputum production, shortness of breath and wheezing.   ?Cardiovascular:  Negative for chest pain, palpitations and orthopnea.  ?Neurological:  Negative for weakness.  ?All other systems reviewed and are negative. ? ?BP 126/80 (BP Location: Left Arm, Cuff Size: Large)   Pulse 66   Temp 97.6 ?F (36.4 ?C) (Temporal)   Ht 5' 8.5" (1.74 m)  Wt 293 lb 9.6 oz (133.2 kg)   SpO2 97%   BMI 43.99 kg/m?  ? ? ?Physical Examination:  ? ?General Appearance: No distress  ?EYES PERRLA, EOM intact.   ?NECK Supple, No JVD ?Pulmonary: normal breath sounds, No wheezing.  ?CardiovascularNormal S1,S2.  No m/r/g.   ?ALL OTHER ROS ARE NEGATIVE ? ? ?ASSESSMENT/PLAN  ? ?31 year old pleasant white male seen today for severe sleep apnea previous AHI of 92 with current download of compliance report shows excellent compliance and well-controlled sleep apnea with AHI reduced to 2.5  ? ? ?Severe sleep apnea  ?Continue CPAP therapy as prescribed  ?Patient uses and benefits from therapy  ?Excellent compliance  report ? ?Obesity ?-recommend significant weight loss ?-recommend changing diet ? ?Deconditioned state ?-Recommend increased daily activity and exercise ? ? ? ?MEDICATION ADJUSTMENTS/LABS AND TESTS ORDERED: ?Continue auto CPAP as prescribed ?Continue auto CPAP as prescribed will change to AUTO CPAP 5-12 cmH20 ?Re-establish with DME company ? ? ?CURRENT MEDICATIONS REVIEWED AT LENGTH WITH PATIENT TODAY ? ? ?Patient  satisfied with Plan of action and management. All questions answered ? ?Follow up 1 year  ? ?Total Time Spent  21 mins ? ? ?Lucie Leather, M.D.  ?[facility]  ?Medical Director [facility] [place] ?Medical Director [facility] Cardio-Pulmonary Department  ? ? ? ? ? ? ? ? ? ? ? ?

## 2021-06-04 ENCOUNTER — Ambulatory Visit: Payer: Managed Care, Other (non HMO) | Admitting: Family

## 2021-06-04 ENCOUNTER — Encounter: Payer: Self-pay | Admitting: Family

## 2021-06-04 VITALS — BP 122/82 | HR 81 | Temp 100.0°F | Resp 16 | Ht 68.5 in | Wt 290.2 lb

## 2021-06-04 DIAGNOSIS — D721 Eosinophilia, unspecified: Secondary | ICD-10-CM

## 2021-06-04 DIAGNOSIS — J029 Acute pharyngitis, unspecified: Secondary | ICD-10-CM

## 2021-06-04 DIAGNOSIS — Z20822 Contact with and (suspected) exposure to covid-19: Secondary | ICD-10-CM

## 2021-06-04 DIAGNOSIS — R509 Fever, unspecified: Secondary | ICD-10-CM

## 2021-06-04 DIAGNOSIS — R000 Tachycardia, unspecified: Secondary | ICD-10-CM

## 2021-06-04 DIAGNOSIS — R350 Frequency of micturition: Secondary | ICD-10-CM | POA: Diagnosis not present

## 2021-06-04 DIAGNOSIS — J069 Acute upper respiratory infection, unspecified: Secondary | ICD-10-CM

## 2021-06-04 LAB — POC URINALSYSI DIPSTICK (AUTOMATED)
Bilirubin, UA: NEGATIVE
Blood, UA: NEGATIVE
Glucose, UA: NEGATIVE
Ketones, UA: NEGATIVE
Leukocytes, UA: NEGATIVE
Nitrite, UA: NEGATIVE
Protein, UA: NEGATIVE
Spec Grav, UA: <=1.005 — AB (ref 1.010–1.025)
Urobilinogen, UA: 0.2 E.U./dL
pH, UA: 6 (ref 5.0–8.0)

## 2021-06-04 LAB — POC COVID19 BINAXNOW: SARS Coronavirus 2 Ag: NEGATIVE

## 2021-06-04 LAB — POCT RAPID STREP A (OFFICE): Rapid Strep A Screen: NEGATIVE

## 2021-06-04 NOTE — Progress Notes (Signed)
Established Patient Office Visit  Subjective:  Patient ID: Micheal [name], male    DOB: Mar 23, 1990  Age: 31 y.o. MRN: [id number]  CC:  Chief Complaint  Patient presents with  . Fever    X 3 days heart rate went up but then went down.  . Chills    HPI Micheal [name] is here today with concerns.   Decreased appetite this am.  Felt nauseous as well.   For the last three days with fever, chills, joint stiffness, dry mouth, and feeling heart rate is elevated.  Temp was 102.8 at highest two nights ago.  Pt is sweaty right now feels uncomfortable.  Right now with fever as well at 100 F  Took two tylenol 8 am this am.   Elevated eosinophils, pt does   Wt Readings from Last 3 Encounters:  06/04/21 290 lb 4 oz (131.7 kg)  04/18/21 293 lb 9.6 oz (133.2 kg)  04/02/21 293 lb 4 oz (133 kg)     Past Medical History:  Diagnosis Date  . Childhood asthma   . Obstructive sleep apnea   . Palpitations    a. 08/2019 Echo: EF 60-65%, no rwma.   . Perennial allergic rhinitis   . Psoriasis     Past Surgical History:  Procedure Laterality Date  . WISDOM TOOTH EXTRACTION      Family History  Problem Relation Age of Onset  . Valvular heart disease Father        bicuspid aortic valve s/p replacement  . Cancer Maternal Grandfather        pancreatic  . Diabetes Paternal Grandmother   . Hypertension Paternal Grandmother   . Thyroid disease Paternal Grandmother   . Thyroid disease Mother   . Thyroid disease Sister   . Thyroid disease Paternal Grandfather   . CAD Neg Hx   . Stroke Neg Hx     Social History   Socioeconomic History  . Marital status: Single    Spouse name: Not on file  . Number of children: Not on file  . Years of education: Not on file  . Highest education level: Not on file  Occupational History  . Not on file  Tobacco Use  . Smoking status: Never  . Smokeless tobacco: Never  Vaping Use  . Vaping Use: Never used  Substance and Sexual Activity  .  Alcohol use: No  . Drug use: No  . Sexual activity: Not on file  Other Topics Concern  . Not on file  Social History Narrative   Lives with parents   Nephew of Senaida Ores   Edu: HS   Occ: HR rep at [employer]    Activity: active at [employer]. No regular exercise. Just bought elliptical    Diet: joined [employer] watchers   Social Determinants of Health   Financial Resource Strain: Not on file  Food Insecurity: Not on file  Transportation Needs: Not on file  Physical Activity: Not on file  Stress: Not on file  Social Connections: Not on file  Intimate Partner Violence: Not on file    Outpatient Medications Prior to Visit  Medication Sig Dispense Refill  . augmented betamethasone dipropionate (DIPROLENE-AF) 0.05 % cream APPLY TO AFFECTED AREA ON ELBOWS TWICE A DAY UNTIL CLEAR, THEN USE AS NEEDED    . calcipotriene (DOVONOX) 0.005 % cream APPLY ON THE SKIN AS DIRECTED TWICE DAILY TO FACE AND FOLD OF SKIN UNTIL CLEAR THEN AS NEEDED    .  famotidine (PEPCID) 20 MG tablet Take 1 tablet (20 mg total) by mouth at bedtime.    . fluticasone (FLONASE) 50 MCG/ACT nasal spray SPRAY 2 SPRAYS INTO EACH NOSTRIL EVERY DAY 16 mL 2  . hydrOXYzine (ATARAX) 25 MG tablet TAKE 0.5-1 TABLETS (12.5-25 MG TOTAL) BY MOUTH 2 (TWO) TIMES DAILY AS NEEDED FOR ANXIETY. 180 tablet 2  . ketoconazole (NIZORAL) 2 % shampoo APPLY TO AFFECTED AREA EVERY DAY    . levocetirizine (XYZAL) 5 MG tablet Take 5 mg by mouth every evening.    [name] levothyroxine (SYNTHROID) 50 MCG tablet Take 1 tablet (50 mcg total) by mouth daily. 90 tablet 1  . metoprolol tartrate (LOPRESSOR) 50 MG tablet Take 1 tablet (50 mg total) by mouth 2 (two) times daily. 180 tablet 1  . Multiple Vitamin (MULTIVITAMIN ADULT) TABS Take 1 tablet by mouth daily.    . Probiotic Product (PROBIOTIC PO) Take 2 tablets by mouth daily before breakfast.     No facility-administered medications prior to visit.    No Known Allergies  ROS Review  of Systems  Constitutional:  Positive for appetite change (decreased), chills and fever. Negative for fatigue.  HENT:  Positive for ear pain (bil), postnasal drip, sinus pressure and sinus pain. Negative for congestion, rhinorrhea, sneezing and sore throat.   Eyes:  Negative for discharge.  Respiratory:  Negative for cough, chest tightness, shortness of breath and wheezing.   Cardiovascular:  Negative for chest pain and palpitations.  Gastrointestinal:  Positive for nausea. Negative for abdominal pain, constipation, diarrhea and vomiting.     Objective:    Physical Exam  BP 122/82   Pulse 81   Temp 100 F (37.8 C)   Resp 16   Ht 5' 8.5" (1.74 m)   Wt 290 lb 4 oz (131.7 kg)   SpO2 99%   BMI 43.49 kg/m  Wt Readings from Last 3 Encounters:  06/04/21 290 lb 4 oz (131.7 kg)  04/18/21 293 lb 9.6 oz (133.2 kg)  04/02/21 293 lb 4 oz (133 kg)     Health Maintenance Due  Topic Date Due  . COVID-19 Vaccine (1) Never done  . HIV Screening  Never done  . Hepatitis C Screening  Never done    There are no preventive care reminders to display for this patient.  Lab Results  Component Value Date   TSH 2.18 02/09/2021   Lab Results  Component Value Date   WBC 9.2 02/09/2021   HGB 14.7 02/09/2021   HCT 44.0 02/09/2021   MCV 84.5 02/09/2021   PLT 287 02/09/2021   Lab Results  Component Value Date   NA 139 02/09/2021   K 4.8 02/09/2021   CO2 31 02/09/2021   GLUCOSE 92 02/09/2021   BUN 12 02/09/2021   CREATININE 0.94 02/09/2021   BILITOT 0.5 02/09/2021   ALKPHOS 72 03/29/2020   AST 29 02/09/2021   ALT 55 (H) 02/09/2021   PROT 8.2 (H) 02/09/2021   ALBUMIN 4.4 03/29/2020   CALCIUM 10.6 (H) 02/09/2021   ANIONGAP 12 12/09/2017   GFR 110.33 09/06/2020   No results found for: HGBA1C    Assessment & Plan:   Problem List Items Addressed This Visit   None   No orders of the defined types were placed in this encounter.   Follow-up: No follow-ups on file.    Mort Sawyers, FNP

## 2021-06-04 NOTE — Patient Instructions (Signed)
Stop by the lab prior to leaving today. I will notify you of your results once received.   Due to recent changes in healthcare laws, you may see results of your imaging and/or laboratory studies on MyChart before I have had a chance to review them.  I understand that in some cases there may be results that are confusing or concerning to you. Please understand that not all results are received at the same time and often I may need to interpret multiple results in order to provide you with the best plan of care or course of treatment. Therefore, I ask that you please give me 2 business days to thoroughly review all your results before contacting my office for clarification. Should we see a critical lab result, you will be contacted sooner.   It was a pleasure seeing you today! Please do not hesitate to reach out with any questions and or concerns.  Regards,   Chen Saadeh FNP-C  

## 2021-06-05 ENCOUNTER — Other Ambulatory Visit: Payer: Managed Care, Other (non HMO)

## 2021-06-05 ENCOUNTER — Encounter: Payer: Self-pay | Admitting: Family

## 2021-06-05 ENCOUNTER — Other Ambulatory Visit: Payer: Self-pay | Admitting: Family

## 2021-06-05 ENCOUNTER — Emergency Department (HOSPITAL_COMMUNITY)
Admission: EM | Admit: 2021-06-05 | Discharge: 2021-06-06 | Disposition: A | Discharge status: Home / Self Care | Payer: Managed Care, Other (non HMO) | Attending: Emergency Medicine | Admitting: Emergency Medicine

## 2021-06-05 ENCOUNTER — Other Ambulatory Visit: Payer: Self-pay

## 2021-06-05 ENCOUNTER — Encounter (HOSPITAL_COMMUNITY): Payer: Self-pay | Admitting: Emergency Medicine

## 2021-06-05 DIAGNOSIS — R509 Fever, unspecified: Secondary | ICD-10-CM | POA: Insufficient documentation

## 2021-06-05 DIAGNOSIS — Z20822 Contact with and (suspected) exposure to covid-19: Secondary | ICD-10-CM | POA: Insufficient documentation

## 2021-06-05 DIAGNOSIS — D696 Thrombocytopenia, unspecified: Secondary | ICD-10-CM

## 2021-06-05 DIAGNOSIS — E119 Type 2 diabetes mellitus without complications: Secondary | ICD-10-CM | POA: Insufficient documentation

## 2021-06-05 DIAGNOSIS — I10 Essential (primary) hypertension: Secondary | ICD-10-CM | POA: Insufficient documentation

## 2021-06-05 DIAGNOSIS — R002 Palpitations: Secondary | ICD-10-CM | POA: Diagnosis not present

## 2021-06-05 DIAGNOSIS — R000 Tachycardia, unspecified: Secondary | ICD-10-CM | POA: Insufficient documentation

## 2021-06-05 DIAGNOSIS — J029 Acute pharyngitis, unspecified: Secondary | ICD-10-CM | POA: Insufficient documentation

## 2021-06-05 DIAGNOSIS — R350 Frequency of micturition: Secondary | ICD-10-CM | POA: Insufficient documentation

## 2021-06-05 DIAGNOSIS — B349 Viral infection, unspecified: Secondary | ICD-10-CM | POA: Insufficient documentation

## 2021-06-05 DIAGNOSIS — Z79899 Other long term (current) drug therapy: Secondary | ICD-10-CM | POA: Diagnosis not present

## 2021-06-05 DIAGNOSIS — J069 Acute upper respiratory infection, unspecified: Secondary | ICD-10-CM | POA: Insufficient documentation

## 2021-06-05 LAB — COMPREHENSIVE METABOLIC PANEL
ALT: 46 U/L (ref 0–53)
AST: 37 U/L (ref 0–37)
Albumin: 4.4 g/dL (ref 3.5–5.2)
Alkaline Phosphatase: 70 U/L (ref 39–117)
BUN: 10 mg/dL (ref 6–23)
CO2: 28 mEq/L (ref 19–32)
Calcium: 9 mg/dL (ref 8.4–10.5)
Chloride: 95 mEq/L — ABNORMAL LOW (ref 96–112)
Creatinine, Ser: 0.96 mg/dL (ref 0.40–1.50)
GFR: 104.28 mL/min (ref >60.00)
Glucose, Bld: 116 mg/dL — ABNORMAL HIGH (ref 70–99)
Potassium: 4.3 mEq/L (ref 3.5–5.1)
Sodium: 130 mEq/L — ABNORMAL LOW (ref 135–145)
Total Bilirubin: 0.8 mg/dL (ref 0.2–1.2)
Total Protein: 7.2 g/dL (ref 6.0–8.3)

## 2021-06-05 LAB — CBC WITH DIFFERENTIAL/PLATELET
Abs Immature Granulocytes: 0 10*3/uL (ref 0.00–0.07)
Basophils Absolute: 0 10*3/uL (ref 0.0–0.1)
Basophils Absolute: 0 10*3/uL (ref 0.0–0.1)
Basophils Relative: 0.9 % (ref 0.0–3.0)
Basophils Relative: 0 %
Eosinophils Absolute: 0.1 10*3/uL (ref 0.0–0.7)
Eosinophils Absolute: 0.1 10*3/uL (ref 0.0–0.5)
Eosinophils Relative: 1.7 % (ref 0.0–5.0)
Eosinophils Relative: 2 %
HCT: 39.5 % (ref 39.0–52.0)
HCT: 40.4 % (ref 39.0–52.0)
Hemoglobin: 13.4 g/dL (ref 13.0–17.0)
Hemoglobin: 13.7 g/dL (ref 13.0–17.0)
Lymphocytes Relative: 15.5 % (ref 12.0–46.0)
Lymphocytes Relative: 28 %
Lymphs Abs: 0.5 10*3/uL — ABNORMAL LOW (ref 0.7–4.0)
Lymphs Abs: 1.2 10*3/uL (ref 0.7–4.0)
MCH: 28.3 pg (ref 26.0–34.0)
MCHC: 33.8 g/dL (ref 30.0–36.0)
MCHC: 33.9 g/dL (ref 30.0–36.0)
MCV: 83.5 fl (ref 78.0–100.0)
MCV: 83.5 fL (ref 80.0–100.0)
Monocytes Absolute: 0.3 10*3/uL (ref 0.1–1.0)
Monocytes Absolute: 0.2 10*3/uL (ref 0.1–1.0)
Monocytes Relative: 10.1 % (ref 3.0–12.0)
Monocytes Relative: 4 %
Neutro Abs: 2.3 10*3/uL (ref 1.4–7.7)
Neutro Abs: 2.9 10*3/uL (ref 1.7–7.7)
Neutrophils Relative %: 71.8 % (ref 43.0–77.0)
Neutrophils Relative %: 66 %
Platelets: 95 10*3/uL — ABNORMAL LOW (ref 150.0–400.0)
Platelets: 114 10*3/uL — ABNORMAL LOW (ref 150–400)
RBC: 4.73 Mil/uL (ref 4.22–5.81)
RBC: 4.84 MIL/uL (ref 4.22–5.81)
RDW: 13.6 % (ref 11.5–15.5)
RDW: 12.5 % (ref 11.5–15.5)
WBC: 3.3 10*3/uL — ABNORMAL LOW (ref 4.0–10.5)
WBC: 4.4 10*3/uL (ref 4.0–10.5)
nRBC: 0 % (ref 0.0–0.2)

## 2021-06-05 LAB — BASIC METABOLIC PANEL
Anion gap: 11 (ref 5–15)
BUN: 8 mg/dL (ref 6–20)
CO2: 23 mmol/L (ref 22–32)
Calcium: 9.1 mg/dL (ref 8.9–10.3)
Chloride: 100 mmol/L (ref 98–111)
Creatinine, Ser: 0.89 mg/dL (ref 0.61–1.24)
GFR, Estimated: >60 mL/min (ref >60)
Glucose, Bld: 97 mg/dL (ref 70–99)
Potassium: 4.2 mmol/L (ref 3.5–5.1)
Sodium: 134 mmol/L — ABNORMAL LOW (ref 135–145)

## 2021-06-05 LAB — TSH: TSH: 2.43 u[IU]/mL (ref 0.35–5.50)

## 2021-06-05 NOTE — ED Triage Notes (Signed)
Patient c/o fever x 4 days.  Patient also endorses elevated HR.  Patient seen by PCP and was told "this is normal" but to return if HR went above 130-150.  Patient reports sitting in recliner, got a chill, and his HR went to 100, and then "felt like it was racing."  Patient reports his HR was 145 when he checked it and reports it went back down.  Patient denies pain. Patient denies n/v, and endorses diarrhea.

## 2021-06-05 NOTE — Assessment & Plan Note (Signed)
Strep tested in office, negative Warm salt water gargles   

## 2021-06-05 NOTE — Assessment & Plan Note (Signed)
covid tested in office, negative.  

## 2021-06-05 NOTE — Assessment & Plan Note (Signed)
Suspected viral Advised patient on supportive measures:  Be sure to rest, drink plenty of fluids, and use tylenol or ibuprofen as needed for pain. Follow up if fever >101, if symptoms worsen or if symptoms are not improved in 3 days. Patient verbalizes understanding.

## 2021-06-05 NOTE — Progress Notes (Unsigned)
a 

## 2021-06-05 NOTE — Assessment & Plan Note (Signed)
Stable in office Advised pt will typically increase with fever Control fever with tylenol and ibuprofen Labs today to r/o other etiologies Increase water intake to stay hydrated

## 2021-06-05 NOTE — ED Provider Triage Note (Signed)
Emergency Medicine Provider Triage Evaluation Note  Micheal [name] , a 31 y.o. male  was evaluated in triage.  Pt complains of fever since 06/02/2021. Feels his HR is elevated, feels like his heart is beating out of chest. Symptoms are intermittent. Reports diarrhea but no vomiting. Fever Tmax 102. Taking antipyretics at home, L ear fullness. Decreased appetite.  Review of Systems  Positive: Fever, increased HR, L ear fullness Negative: Chest pain, shortness of breath  Physical Exam  There were no vitals taken for this visit. Gen:   Awake, no distress   Resp:  Normal effort  MSK:   Moves extremities without difficulty  Other:    Medical Decision Making  Medically screening exam initiated at 8:38 PM.  Appropriate orders placed.  Micheal [name] was informed that the remainder of the evaluation will be completed by another provider, this initial triage assessment does not replace that evaluation, and the importance of remaining in the ED until their evaluation is complete.  Will order labs and EKG   Dietrich Pates, PA-C 06/05/21 2048

## 2021-06-05 NOTE — Assessment & Plan Note (Signed)
Urine culture ordered pending results Ordered cbc cmp Ordered flu test in office , negative Tylenol ibuprofen prn

## 2021-06-05 NOTE — Assessment & Plan Note (Signed)
Ordered urine culture pending results

## 2021-06-05 NOTE — Progress Notes (Signed)
Micheal [name] can we add on peripheral smear review? I added this on in future order.  Suddenly with new dropped plts I want to ensure this is accurate.

## 2021-06-05 NOTE — Assessment & Plan Note (Signed)
Continue to monitor suspected due to psorasis

## 2021-06-06 ENCOUNTER — Emergency Department (HOSPITAL_COMMUNITY): Payer: Managed Care, Other (non HMO)

## 2021-06-06 ENCOUNTER — Encounter: Payer: Self-pay | Admitting: Internal Medicine

## 2021-06-06 ENCOUNTER — Encounter: Payer: Self-pay | Admitting: Family Medicine

## 2021-06-06 LAB — TROPONIN I (HIGH SENSITIVITY): Troponin I (High Sensitivity): 3 ng/L (ref <18)

## 2021-06-06 LAB — PATHOLOGIST SMEAR REVIEW

## 2021-06-06 NOTE — ED Provider Notes (Signed)
[facility] EMERGENCY DEPARTMENT Provider Note   CSN: [id number] Arrival date & time: 06/05/21  2029     History  Chief Complaint  Patient presents with   Fever    Micheal [name] is a 32 y.o. male.  HPI  With medical history including heart palpitations, anxiety presents with complaints of heart palpitations.  Patient states that her palpitations started today, states that he was sitting down all of a sudden he felt that his heart started to race, he states he looked at his watch and had a heart rate in the 150s, states he can feel his heart pounding, he had no chest pain or shortness of breath but did become diaphoretic, he states this lasted approximately 15 minutes and then resolved, there is no lightheaded dizziness or near syncope, he has no cardiac history, he is not active being treated for diabetes hypertension hyperlipidemia no family history of cardiac abnormalities.  He has no history of PEs or DVTs currently on hormone therapy, denies any illicit drug use or excessive caffeine consumption.  He states that he has been sick for last 4 days, has had fevers chills and general body aches no cough congestion stomach pains nausea vomiting, slight diarrhea.    Home Medications Prior to Admission medications   Medication Sig Start Date End Date Taking? Authorizing Provider  augmented betamethasone dipropionate (DIPROLENE-AF) 0.05 % cream APPLY TO AFFECTED AREA ON ELBOWS TWICE A DAY UNTIL CLEAR, THEN USE AS NEEDED 09/28/18   [provider]  calcipotriene (DOVONOX) 0.005 % cream APPLY ON THE SKIN AS DIRECTED TWICE DAILY TO FACE AND FOLD OF SKIN UNTIL CLEAR THEN AS NEEDED 10/07/18   [provider]  famotidine (PEPCID) 20 MG tablet Take 1 tablet (20 mg total) by mouth at bedtime. 01/03/20   Ria Bush, MD  fluticasone [facility]) 50 MCG/ACT nasal spray SPRAY 2 SPRAYS INTO EACH NOSTRIL EVERY DAY 03/20/21   Ria Bush, MD  hydrOXYzine (ATARAX)  25 MG tablet TAKE 0.5-1 TABLETS (12.5-25 MG TOTAL) BY MOUTH 2 (TWO) TIMES DAILY AS NEEDED FOR ANXIETY. 03/30/21   Ria Bush, MD  ketoconazole (NIZORAL) 2 % shampoo APPLY TO AFFECTED AREA EVERY DAY 01/27/19   [provider]  levocetirizine (XYZAL) 5 MG tablet Take 5 mg by mouth every evening.    [provider]  levothyroxine (SYNTHROID) 50 MCG tablet Take 1 tablet (50 mcg total) by mouth daily. 03/08/21   Ria Bush, MD  metoprolol tartrate (LOPRESSOR) 50 MG tablet Take 1 tablet (50 mg total) by mouth 2 (two) times daily. 03/08/21   Ria Bush, MD  Multiple Vitamin (MULTIVITAMIN ADULT) TABS Take 1 tablet by mouth daily. 04/05/20   Ria Bush, MD  Probiotic Product (PROBIOTIC PO) Take 2 tablets by mouth daily before breakfast.    [provider]      Allergies    Patient has no known allergies.    Review of Systems   Review of Systems  Constitutional:  Positive for fever. Negative for chills.  Respiratory:  Negative for shortness of breath.   Cardiovascular:  Positive for palpitations. Negative for chest pain.  Gastrointestinal:  Negative for abdominal pain.  Neurological:  Negative for headaches.   Physical Exam Updated Vital Signs BP (!) 141/86   Pulse 78   Temp 98.2 F (36.8 C) (Oral)   Resp 18   Ht 5\' 9"  (1.753 m)   Wt 131.5 kg   SpO2 100%   BMI 42.83 kg/m  Physical Exam  Vitals and nursing note reviewed.  Constitutional:      General: He is not in acute distress.    Appearance: He is not ill-appearing.  HENT:     Head: Normocephalic and atraumatic.     Nose: No congestion.  Eyes:     Conjunctiva/sclera: Conjunctivae normal.  Cardiovascular:     Rate and Rhythm: Normal rate and regular rhythm.     Pulses: Normal pulses.     Heart sounds: No murmur heard.   No friction rub. No gallop.  Pulmonary:     Effort: No respiratory distress.     Breath sounds: No wheezing, rhonchi or rales.  Skin:    General: Skin is warm  and dry.  Neurological:     Mental Status: He is alert.  Psychiatric:        Mood and Affect: Mood normal.    ED Results / Procedures / Treatments   Labs (all labs ordered are listed, but only abnormal results are displayed) Labs Reviewed  BASIC METABOLIC PANEL - Abnormal; Notable for the following components:      Result Value   Sodium 134 (*)    All other components within normal limits  CBC WITH DIFFERENTIAL/PLATELET - Abnormal; Notable for the following components:   Platelets 114 (*)    All other components within normal limits  TROPONIN I (HIGH SENSITIVITY)    EKG None  Radiology DG Chest 2 View  Result Date: 06/06/2021 CLINICAL DATA:  Heart palpitations. EXAM: CHEST - 2 VIEW COMPARISON:  December 09, 2017 FINDINGS: The heart size and mediastinal contours are within normal limits. Low lung volumes are again seen. Both lungs are clear. The visualized skeletal structures are unremarkable. IMPRESSION: No active cardiopulmonary disease. Electronically Signed   By: Virgina Norfolk M.D.   On: 06/06/2021 01:26    Procedures Procedures    Medications Ordered in ED Medications - No data to display  ED Course/ Medical Decision Making/ A&P                           Medical Decision Making Amount and/or Complexity of Data Reviewed Radiology: ordered.   This patient presents to the ED for concern of heart palpitations, this involves an extensive number of treatment options, and is a complaint that carries with it a high risk of complications and morbidity.  The differential diagnosis includes PE, ACS, pneumonia, metabolic derailments    Additional history obtained:  Additional history obtained from N/A External records from outside source obtained and reviewed including previous cardiology notes, echocardiogram   Co morbidities that complicate the patient evaluation  Anxiety, heart palpitations  Social Determinants of Health:  N/a    Lab Tests:  I Ordered,  and personally interpreted labs.  The pertinent results include: CBC unremarkable, BMP shows sodium 134, first troponin is 3.   Imaging Studies ordered:  I ordered imaging studies including chest x-ray I independently visualized and interpreted imaging which showed x-rays unremarkable I agree with the radiologist interpretation   Cardiac Monitoring:  The patient was maintained on a cardiac monitor.  I personally viewed and interpreted the cardiac monitored which showed an underlying rhythm of: EKG without signs of ischemia   Medicines ordered and prescription drug management:  I ordered medication including N/A I have reviewed the patients home medicines and have made adjustments as needed  Critical Interventions:  N/A   Reevaluation:  Presents with heart palpitations, triage obtained basic lab work-up with  I personally reviewed they are unremarkable, he had a benign physical exam, will add on chest x-ray, troponins and reassess.  Reassessed update on lab work imaging he is having no complaints and states he is ready for discharge.  Consultations Obtained:  N/A    Test Considered:  Second troponin-this was deferred as my suspicion for ACS is very low at this time, he has been chest pain-free for greater than 12 hours, if ACS was present would expect elevation first troponin.    Rule out I have low suspicion for ACS as history is atypical, patient has no cardiac history, EKG was sinus rhythm without signs of ischemia, patient had a negative troponin.  Low suspicion for PE as patient denies pleuritic chest pain, shortness of breath, patient denies leg pain, no pedal edema noted on exam, patient was PERC negative.  Low suspicion for AAA or aortic dissection as history is atypical, patient has low risk factors.  Low suspicion for systemic infection as patient is nontoxic-appearing, vital signs reassuring, no obvious source infection noted on exam.  Low suspicion for  intra-abdominal abnormalities abdomen soft nontender nonsurgical abdomen still tolerating p.o.  Low suspicion for meningitis as he has no meningeal sign.     Dispostion and problem list  After consideration of the diagnostic results and the patients response to treatment, I feel that the patent would benefit from discharge.  Heart palpitations-patient has a history of heart palpitations, this is likely provoked by his viral infection, he is currently on metoprolol-continue with this.  And have him follow-up with cardiology for further evaluation. Fevers malaise-likely viral in nature, will recommend symptom management follow-up PCP as needed strict return precautions.            Final Clinical Impression(s) / ED Diagnoses Final diagnoses:  Viral illness  Palpitations    Rx / DC Orders ED Discharge Orders     None         Marcello Fennel, PA-C [id number] [id number]    Delora Fuel, MD [id number] [phone]

## 2021-06-06 NOTE — Discharge Instructions (Signed)
Lab work and imaging all reassuring May use ibuprofen Tylenol as needed for pain and fever control.  Please remember to stay hydrated.  If you notice that your fever persists daily for 8 days straight I would recommend you come back in for reevaluation/follow-up with your primary care provider.  I  recommend abstaining from excessive caffeine consumption as this can worsen your heart palpitations.  Please continue with your metoprolol.  I recommend that you follow-up with cardiology for further evaluation of heart palpitations.  Come back to the emergency department if you develop chest pain, shortness of breath, severe abdominal pain, uncontrolled nausea, vomiting, diarrhea.

## 2021-06-06 NOTE — ED Notes (Signed)
Pt to xray

## 2021-06-07 ENCOUNTER — Other Ambulatory Visit: Payer: Self-pay | Admitting: Family

## 2021-06-07 ENCOUNTER — Ambulatory Visit (INDEPENDENT_AMBULATORY_CARE_PROVIDER_SITE_OTHER): Payer: Managed Care, Other (non HMO) | Admitting: *Deleted

## 2021-06-07 DIAGNOSIS — E871 Hypo-osmolality and hyponatremia: Secondary | ICD-10-CM | POA: Diagnosis not present

## 2021-06-07 DIAGNOSIS — D696 Thrombocytopenia, unspecified: Secondary | ICD-10-CM | POA: Diagnosis not present

## 2021-06-07 LAB — CBC WITH DIFFERENTIAL/PLATELET
Basophils Absolute: 0 10*3/uL (ref 0.0–0.1)
Basophils Relative: 0.6 % (ref 0.0–3.0)
Eosinophils Absolute: 0.3 10*3/uL (ref 0.0–0.7)
Eosinophils Relative: 5.3 % — ABNORMAL HIGH (ref 0.0–5.0)
HCT: 38.4 % — ABNORMAL LOW (ref 39.0–52.0)
Hemoglobin: 12.9 g/dL — ABNORMAL LOW (ref 13.0–17.0)
Lymphocytes Relative: 44.2 % (ref 12.0–46.0)
Lymphs Abs: 2.8 10*3/uL (ref 0.7–4.0)
MCHC: 33.7 g/dL (ref 30.0–36.0)
MCV: 82.6 fl (ref 78.0–100.0)
Monocytes Absolute: 0.6 10*3/uL (ref 0.1–1.0)
Monocytes Relative: 9.2 % (ref 3.0–12.0)
Neutro Abs: 2.5 10*3/uL (ref 1.4–7.7)
Neutrophils Relative %: 40.7 % — ABNORMAL LOW (ref 43.0–77.0)
Platelets: 136 10*3/uL — ABNORMAL LOW (ref 150.0–400.0)
RBC: 4.65 Mil/uL (ref 4.22–5.81)
RDW: 13.2 % (ref 11.5–15.5)
WBC: 6.3 10*3/uL (ref 4.0–10.5)

## 2021-06-07 LAB — BASIC METABOLIC PANEL
BUN: 8 mg/dL (ref 6–23)
CO2: 31 mEq/L (ref 19–32)
Calcium: 9.4 mg/dL (ref 8.4–10.5)
Chloride: 99 mEq/L (ref 96–112)
Creatinine, Ser: 0.81 mg/dL (ref 0.40–1.50)
GFR: 116.32 mL/min (ref >60.00)
Glucose, Bld: 91 mg/dL (ref 70–99)
Potassium: 4.3 mEq/L (ref 3.5–5.1)
Sodium: 135 mEq/L (ref 135–145)

## 2021-06-07 NOTE — Progress Notes (Signed)
This is note 2 of 2. Can we have patient come in for repeat CBC and BMP because there was some clumping seen on his blood sample and I want to make sure that these are accurate.  Have already placed the order

## 2021-06-07 NOTE — Progress Notes (Signed)
Can we please call pt and have him schedule lab repeat of cbc, there was some plt clumping and plts were lower than normal. Want to repeat to make sure not lab error.   Also ask pt how he is feeling.

## 2021-06-08 ENCOUNTER — Encounter: Payer: Self-pay | Admitting: Internal Medicine

## 2021-06-08 ENCOUNTER — Ambulatory Visit: Payer: Managed Care, Other (non HMO) | Admitting: Internal Medicine

## 2021-06-08 VITALS — BP 128/75 | HR 56 | Ht 69.0 in | Wt 290.0 lb

## 2021-06-08 DIAGNOSIS — I10 Essential (primary) hypertension: Secondary | ICD-10-CM

## 2021-06-08 DIAGNOSIS — R002 Palpitations: Secondary | ICD-10-CM | POA: Diagnosis not present

## 2021-06-08 NOTE — Progress Notes (Signed)
Platelet repeat was a little better Is fever resolving? Pt reported no bruising Slight new anemia, has pt noticed any blood in stool?  Sodium improved, likely was a lab draw error as clumping on prior lab draw.   Pt does have f/u with Dr. Reece Agar 5/30 (Dr. Reece Agar pt had fever and possible viral illness when he was seen in office)

## 2021-06-08 NOTE — Progress Notes (Unsigned)
Follow-up Outpatient Visit Date: 06/08/2021  Primary Care Provider: Ria Bush, [place] [address]  Chief Complaint: Palpitations  HPI:  Micheal [name] is a 31 y.o. male with history of palpitations, hyperlipidemia, obstructive sleep apnea, psoriasis, childhood asthma, allergies, and eosinophilia, who presents for follow-up of palpitations.  He was last evaluated in our office in 09/2019 via virtual visit by Ignacia Bayley, NP.  He noted sporadic elevations in his heart rate at that time, most often when going to bed.  He wondered if it was related to GERD.  No medication changes or additional testing were pursued.  He presented to the [facility] emergency department 3 days ago complaining of palpitations and heart rate in the 150s on his Apple Watch.  ED work-up was unrevealing.  Possible recent viral infection provoking his tachycardia was noted.  He was advised to follow-up with [place].  Mr. Micheal [name] reports that he first developed chills and achiness a week ago while at [facility].  The next evening, he again had chills and achiness with associated fevers up of up 102.8 degrees Fahrenheit.  He uses APAP and ibuprofen for symptomatic relief and subsequently saw his PCP where COVID-19, influenza, and strep tests were negative.  He noticed that his resting HR was 10-20 bpm higher than normal but figured that was related to his infection.  Three days ago, he suddenly felt his heart pounding more forcefully while seated in his recliner.  He checked his HR on his Apple Watch and found it to be near 150 bpm.  He was able to perform a rhythm strip on the watch, which demonstrates sinus tachycardia with a HR of 126 bpm.  He denies associated chest pain, lightheadedness, and frank dyspnea, though he felt like his breathing was a bit tremulous when he would try to take a deep breath.  Symptoms persisted about an hour, prompting him to go to the ED for further evaluation.  Today, Mr.  Micheal [name] feels back to his baseline.  He denies further palpitations, chest pain, shortness of breath, lightheadedness, and edema.  He drinks ~3 cups of coffee per week but otherwise does not consume any caffeine.  He has been taking metoprolol, as prescribed.  He did not take any cold medications other than the aforementioned APAP and ibuprofen.  He is planning to start [place] soon.  --------------------------------------------------------------------------------------------------  Cardiovascular History & Procedures: Cardiovascular Problems: Palpitations   Risk Factors: Hyperlipidemia, male gender, and morbid obesity   Cath/PCI: None   CV Surgery: None   EP Procedures and Devices: None   Non-Invasive Evaluation(s): TTE (08/17/2019): Normal LV size and wall thickness.  LVEF 60-65% with normal wall motion.  Normal RV size and function.  Normal biatrial size.  No pericardial effusion.  No significant valvular abnormality.  Normal CVP.  Recent CV Pertinent Labs: Lab Results  Component Value Date   CHOL 184 03/29/2020   HDL 34.30 (L) 03/29/2020   LDLCALC 115 (H) 03/29/2020   LDLCALC 125 (H) 04/02/2019   LDLDIRECT 151.0 11/09/2015   TRIG 172.0 (H) 03/29/2020   CHOLHDL 5 03/29/2020   K 4.3 06/07/2021   BUN 8 06/07/2021   CREATININE 0.81 06/07/2021   CREATININE 0.94 02/09/2021    Past medical and surgical history were reviewed and updated in EPIC.  Current Meds  Medication Sig   augmented betamethasone dipropionate (DIPROLENE-AF) 0.05 % cream APPLY TO AFFECTED AREA ON ELBOWS TWICE A DAY UNTIL CLEAR, THEN USE AS NEEDED  calcipotriene (DOVONOX) 0.005 % cream APPLY ON THE SKIN AS DIRECTED TWICE DAILY TO FACE AND FOLD OF SKIN UNTIL CLEAR THEN AS NEEDED   famotidine (PEPCID) 20 MG tablet Take 1 tablet (20 mg total) by mouth at bedtime.   fluticasone (FLONASE) 50 MCG/ACT nasal spray SPRAY 2 SPRAYS INTO EACH NOSTRIL EVERY DAY   hydrOXYzine (ATARAX) 25 MG tablet TAKE 0.5-1 TABLETS  (12.5-25 MG TOTAL) BY MOUTH 2 (TWO) TIMES DAILY AS NEEDED FOR ANXIETY.   ketoconazole (NIZORAL) 2 % shampoo APPLY TO AFFECTED AREA EVERY DAY   levocetirizine (XYZAL) 5 MG tablet Take 5 mg by mouth every evening.   levothyroxine (SYNTHROID) 50 MCG tablet Take 1 tablet (50 mcg total) by mouth daily.   metoprolol tartrate (LOPRESSOR) 50 MG tablet Take 1 tablet (50 mg total) by mouth 2 (two) times daily.   Multiple Vitamin (MULTIVITAMIN ADULT) TABS Take 1 tablet by mouth daily.   Probiotic Product (PROBIOTIC PO) Take 2 tablets by mouth daily before breakfast.    Allergies: Patient has no known allergies.  Social History   Tobacco Use   Smoking status: Never   Smokeless tobacco: Never  Vaping Use   Vaping Use: Never used  Substance Use Topics   Alcohol use: No   Drug use: No    Family History  Problem Relation Age of Onset   Valvular heart disease Father        bicuspid aortic valve s/p replacement   Cancer Maternal Grandfather        pancreatic   Diabetes Paternal Grandmother    Hypertension Paternal Grandmother    Thyroid disease Paternal Grandmother    Thyroid disease Mother    Thyroid disease Sister    Thyroid disease Paternal Grandfather    CAD Neg Hx    Stroke Neg Hx     Review of Systems: A 12-system review of systems was performed and was negative except as noted in the HPI.  --------------------------------------------------------------------------------------------------  Physical Exam: BP 128/75 (BP Location: Left Arm, Patient Position: Sitting, Cuff Size: Large)   Pulse (!) 56   Ht 5\' 9"  (1.753 m)   Wt 290 lb (131.5 kg)   BMI 42.83 kg/m   General:  NAD. Neck: No JVD or HJR. Lungs: Clear to auscultation bilaterally without wheezes or crackles. Heart: Bradycardic but regular without murmurs, rubs, or gallops. Abdomen: Soft, nontender, nondistended. Extremities: No lower extremity edema.  EKG:  Sinus bradycardia (HR 56 bpm).  Otherwise, no  abnormality.  Lab Results  Component Value Date   WBC 6.3 06/07/2021   HGB 12.9 (L) 06/07/2021   HCT 38.4 (L) 06/07/2021   MCV 82.6 06/07/2021   PLT 136.0 (L) 06/07/2021    Lab Results  Component Value Date   NA 135 06/07/2021   K 4.3 06/07/2021   CL 99 06/07/2021   CO2 31 06/07/2021   BUN 8 06/07/2021   CREATININE 0.81 06/07/2021   GLUCOSE 91 06/07/2021   ALT 46 06/04/2021    Lab Results  Component Value Date   CHOL 184 03/29/2020   HDL 34.30 (L) 03/29/2020   LDLCALC 115 (H) 03/29/2020   LDLDIRECT 151.0 11/09/2015   TRIG 172.0 (H) 03/29/2020   CHOLHDL 5 03/29/2020    --------------------------------------------------------------------------------------------------  ASSESSMENT AND PLAN: Palpitations: Symptoms are most consistent with PSVT, though rhythm strip during the episode recorded on Mr. Stengel Apple Watch shows only sinus tachycardia.  Episode could have been precipitated by recent infection, most likely viral in nature.  As he was  otherwise asymptomatic and is back to baseline without further palpitations, we will defer further evaluation today.  Of note, recent labs by his PCP and in the ED were unrevealing.  I have asked Mr. Fekete to obtain a rhythm strip on his watch if his symptoms recur.  We also discussed vagal maneuvers to help abort an episode.  We will continue his current regimen of metoprolol tartrate 50 mg BID.  He can take an additional 0.5-1 tablet if he has recurrent palpitations.  Hypertension: BP normal today.  No medication changes at this time.  Morbid obesity: BMI > 40.  Weight loss encouraged through lifestyle modifications.  Follow-up: Return to clinic in 6 months.  Nelva Bush, MD 06/08/2021 10:35 AM

## 2021-06-08 NOTE — Patient Instructions (Signed)

## 2021-06-09 ENCOUNTER — Encounter: Payer: Self-pay | Admitting: Internal Medicine

## 2021-06-12 ENCOUNTER — Other Ambulatory Visit: Payer: Managed Care, Other (non HMO)

## 2021-06-12 ENCOUNTER — Telehealth: Payer: Self-pay

## 2021-06-12 ENCOUNTER — Other Ambulatory Visit: Payer: Self-pay | Admitting: Family Medicine

## 2021-06-12 DIAGNOSIS — D721 Eosinophilia, unspecified: Secondary | ICD-10-CM

## 2021-06-12 DIAGNOSIS — Z1159 Encounter for screening for other viral diseases: Secondary | ICD-10-CM

## 2021-06-12 DIAGNOSIS — E782 Mixed hyperlipidemia: Secondary | ICD-10-CM

## 2021-06-12 NOTE — Telephone Encounter (Signed)
[name] - Client Nonclinical Telephone Record  [facility] Client Micheal [name] - Client Client Site [place] Provider Ria Bush - MD Contact Type Call Who Is Calling Patient / Member / Family / Caregiver [name] Name Holland Patent Phone Number [phone] Patient Name Micheal [name] Patient DOB 01-01-1991 Call Type Message Only Information Provided Reason for Call Request to [place] Appointment Initial Comment [name] states he is needing to cancel an appointment. General info hours provided Patient request to speak to RN No Disp. Time Disposition Final User 06/12/2021 7:21:58 AM General Information Provided Yes Domingo Dimes, [place] Call Closed By: Memory Argue Transaction Date/Time: 06/12/2021 7:20:03 AM (ET

## 2021-06-18 ENCOUNTER — Ambulatory Visit (INDEPENDENT_AMBULATORY_CARE_PROVIDER_SITE_OTHER): Payer: Managed Care, Other (non HMO) | Admitting: Family Medicine

## 2021-06-18 ENCOUNTER — Encounter: Payer: Self-pay | Admitting: Family Medicine

## 2021-06-18 VITALS — BP 124/76 | HR 66 | Temp 97.6°F | Ht 68.75 in | Wt 286.2 lb

## 2021-06-18 DIAGNOSIS — R002 Palpitations: Secondary | ICD-10-CM

## 2021-06-18 DIAGNOSIS — Z0000 Encounter for general adult medical examination without abnormal findings: Secondary | ICD-10-CM | POA: Diagnosis not present

## 2021-06-18 DIAGNOSIS — Z1159 Encounter for screening for other viral diseases: Secondary | ICD-10-CM

## 2021-06-18 DIAGNOSIS — D721 Eosinophilia, unspecified: Secondary | ICD-10-CM

## 2021-06-18 DIAGNOSIS — E782 Mixed hyperlipidemia: Secondary | ICD-10-CM | POA: Diagnosis not present

## 2021-06-18 DIAGNOSIS — D696 Thrombocytopenia, unspecified: Secondary | ICD-10-CM | POA: Diagnosis not present

## 2021-06-18 DIAGNOSIS — I10 Essential (primary) hypertension: Secondary | ICD-10-CM

## 2021-06-18 DIAGNOSIS — J3089 Other allergic rhinitis: Secondary | ICD-10-CM

## 2021-06-18 DIAGNOSIS — L409 Psoriasis, unspecified: Secondary | ICD-10-CM

## 2021-06-18 DIAGNOSIS — Z114 Encounter for screening for human immunodeficiency virus [HIV]: Secondary | ICD-10-CM

## 2021-06-18 DIAGNOSIS — E039 Hypothyroidism, unspecified: Secondary | ICD-10-CM

## 2021-06-18 DIAGNOSIS — K219 Gastro-esophageal reflux disease without esophagitis: Secondary | ICD-10-CM

## 2021-06-18 LAB — CBC WITH DIFFERENTIAL/PLATELET
Basophils Absolute: 0.1 10*3/uL (ref 0.0–0.1)
Basophils Relative: 1 % (ref 0.0–3.0)
Eosinophils Absolute: 0.8 10*3/uL — ABNORMAL HIGH (ref 0.0–0.7)
Eosinophils Relative: 13.3 % — ABNORMAL HIGH (ref 0.0–5.0)
HCT: 40.1 % (ref 39.0–52.0)
Hemoglobin: 13.5 g/dL (ref 13.0–17.0)
Lymphocytes Relative: 28.8 % (ref 12.0–46.0)
Lymphs Abs: 1.6 10*3/uL (ref 0.7–4.0)
MCHC: 33.6 g/dL (ref 30.0–36.0)
MCV: 83.7 fl (ref 78.0–100.0)
Monocytes Absolute: 0.4 10*3/uL (ref 0.1–1.0)
Monocytes Relative: 7.1 % (ref 3.0–12.0)
Neutro Abs: 2.8 10*3/uL (ref 1.4–7.7)
Neutrophils Relative %: 49.8 % (ref 43.0–77.0)
Platelets: 263 10*3/uL (ref 150.0–400.0)
RBC: 4.79 Mil/uL (ref 4.22–5.81)
RDW: 13.2 % (ref 11.5–15.5)
WBC: 5.7 10*3/uL (ref 4.0–10.5)

## 2021-06-18 LAB — LIPID PANEL
Cholesterol: 160 mg/dL (ref 0–200)
HDL: 35 mg/dL — ABNORMAL LOW
LDL Cholesterol: 88 mg/dL (ref 0–99)
NonHDL: 124.94
Total CHOL/HDL Ratio: 5
Triglycerides: 184 mg/dL — ABNORMAL HIGH (ref 0.0–149.0)
VLDL: 36.8 mg/dL (ref 0.0–40.0)

## 2021-06-18 MED ORDER — LEVOTHYROXINE SODIUM 50 MCG PO TABS: 50.0000 ug | ORAL_TABLET | Freq: Every day | ORAL | 3 refills | Status: DC

## 2021-06-18 MED ORDER — LEVOCETIRIZINE DIHYDROCHLORIDE 5 MG PO TABS: 5.0000 mg | ORAL_TABLET | Freq: Every evening | ORAL | 3 refills | Status: DC

## 2021-06-18 MED ORDER — FAMOTIDINE 20 MG PO TABS: 20.0000 mg | ORAL_TABLET | Freq: Every day | ORAL | 3 refills | Status: DC

## 2021-06-18 NOTE — Assessment & Plan Note (Signed)
Chronic, stable. Continue levothyroxine 50mcg daily.  ?

## 2021-06-18 NOTE — Assessment & Plan Note (Signed)
Update FLP off medication.  

## 2021-06-18 NOTE — Assessment & Plan Note (Signed)
Stable period on pepcid 20mg nightly.  °

## 2021-06-18 NOTE — Assessment & Plan Note (Signed)
Recently saw cardiology with stable evaluation, ?PVST.

## 2021-06-18 NOTE — Patient Instructions (Addendum)
Labs today.  Good to see you today Return as needed or in 1 year for next physical.   Health Maintenance, Male Adopting a healthy lifestyle and getting preventive care are important in promoting health and wellness. Ask your health care provider about: The right schedule for you to have regular tests and exams. Things you can do on your own to prevent diseases and keep yourself healthy. What should I know about diet, weight, and exercise? Eat a healthy diet  Eat a diet that includes plenty of vegetables, fruits, low-fat dairy products, and lean protein. Do not eat a lot of foods that are high in solid fats, added sugars, or sodium. Maintain a healthy weight Body mass index (BMI) is a measurement that can be used to identify possible weight problems. It estimates body fat based on height and weight. Your health care provider can help determine your BMI and help you achieve or maintain a healthy weight. Get regular exercise Get regular exercise. This is one of the most important things you can do for your health. Most adults should: Exercise for at least 150 minutes each week. The exercise should increase your heart rate and make you sweat (moderate-intensity exercise). Do strengthening exercises at least twice a week. This is in addition to the moderate-intensity exercise. Spend less time sitting. Even light physical activity can be beneficial. Watch cholesterol and blood lipids Have your blood tested for lipids and cholesterol at 33 years of age, then have this test every 5 years. You may need to have your cholesterol levels checked more often if: Your lipid or cholesterol levels are high. You are older than 33 years of age. You are at high risk for heart disease. What should I know about cancer screening? Many types of cancers can be detected early and may often be prevented. Depending on your health history and family history, you may need to have cancer screening at various ages. This  may include screening for: Colorectal cancer. Prostate cancer. Skin cancer. Lung cancer. What should I know about heart disease, diabetes, and high blood pressure? Blood pressure and heart disease High blood pressure causes heart disease and increases the risk of stroke. This is more likely to develop in people who have high blood pressure readings or are overweight. Talk with your health care provider about your target blood pressure readings. Have your blood pressure checked: Every 3-5 years if you are 18-39 years of age. Every year if you are 40 years old or older. If you are between the ages of 65 and 75 and are a current or former smoker, ask your health care provider if you should have a one-time screening for abdominal aortic aneurysm (AAA). Diabetes Have regular diabetes screenings. This checks your fasting blood sugar level. Have the screening done: Once every three years after age 45 if you are at a normal weight and have a low risk for diabetes. More often and at a younger age if you are overweight or have a high risk for diabetes. What should I know about preventing infection? Hepatitis B If you have a higher risk for hepatitis B, you should be screened for this virus. Talk with your health care provider to find out if you are at risk for hepatitis B infection. Hepatitis C Blood testing is recommended for: Everyone born from 1945 through 1965. Anyone with known risk factors for hepatitis C. Sexually transmitted infections (STIs) You should be screened each year for STIs, including gonorrhea and chlamydia, if: You are   sexually active and are younger than 33 years of age. You are older than 33 years of age and your health care provider tells you that you are at risk for this type of infection. Your sexual activity has changed since you were last screened, and you are at increased risk for chlamydia or gonorrhea. Ask your health care provider if you are at risk. Ask your health  care provider about whether you are at high risk for HIV. Your health care provider may recommend a prescription medicine to help prevent HIV infection. If you choose to take medicine to prevent HIV, you should first get tested for HIV. You should then be tested every 3 months for as long as you are taking the medicine. Follow these instructions at home: Alcohol use Do not drink alcohol if your health care provider tells you not to drink. If you drink alcohol: Limit how much you have to 0-2 drinks a day. Know how much alcohol is in your drink. In the [place] one drink equals one 12 oz bottle of beer (355 mL), one 5 oz glass of wine (148 mL), or one 1 oz glass of hard liquor (44 mL). Lifestyle Do not use any products that contain nicotine or tobacco. These products include cigarettes, chewing tobacco, and vaping devices, such as e-cigarettes. If you need help quitting, ask your health care provider. Do not use street drugs. Do not share needles. Ask your health care provider for help if you need support or information about quitting drugs. General instructions Schedule regular health, dental, and eye exams. Stay current with your vaccines. Tell your health care provider if: You often feel depressed. You have ever been abused or do not feel safe at home. Summary Adopting a healthy lifestyle and getting preventive care are important in promoting health and wellness. Follow your health care provider's instructions about healthy diet, exercising, and getting tested or screened for diseases. Follow your health care provider's instructions on monitoring your cholesterol and blood pressure. This information is not intended to replace advice given to you by your health care provider. Make sure you discuss any questions you have with your health care provider. Document Revised: 05/22/2020 Document Reviewed: 05/22/2020 Elsevier Patient Education  2023 [employer] Inc[employer]  

## 2021-06-18 NOTE — Assessment & Plan Note (Signed)
?  related to recent presumed viral illness. Update CBC today.

## 2021-06-18 NOTE — Assessment & Plan Note (Signed)
Stable period, recent levels normal.

## 2021-06-18 NOTE — Assessment & Plan Note (Signed)
Continue xyzal and flonase

## 2021-06-18 NOTE — Assessment & Plan Note (Signed)
Chronic, stable on BB regimen.

## 2021-06-18 NOTE — Assessment & Plan Note (Signed)
Preventative protocols reviewed and updated unless pt declined. Discussed healthy diet and lifestyle.  

## 2021-06-18 NOTE — Assessment & Plan Note (Addendum)
Followed by dermatologist Dr Elvera Lennox, planning to start [facility].

## 2021-06-18 NOTE — Progress Notes (Signed)
Patient ID: Micheal [name], male    DOB: 27-May-1990, 31 y.o.   MRN: [id number]  This visit was conducted in person.  BP 124/76   Pulse 66   Temp 97.6 F (36.4 C) (Temporal)   Ht 5' 8.75" (1.746 m)   Wt 286 lb 4 oz (129.8 kg)   SpO2 98%   BMI 42.58 kg/m    CC: CPE Subjective:   HPI: Micheal [name] is a 31 y.o. male presenting on 06/18/2021 for Annual Exam (Requests rx for Xyzal. )   Recent ER evaluation for tachycardia to the 150s in setting of viral illness, thought to have possible SVT.  Saw cardiology Dr. Okey DupreEnd in follow-up-recommend continue metoprolol 50 mg twice daily with additional half to 1 tablet as needed for recurrent palpitations.  ER records and cardiology note reviewed. Had prolonged fever with body aches. Never rash or tick bites, no respiratory symptoms or cough.   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    Eosinophilia seen by Dr Orlie DakinFinnegan - reassuring evaluation rec yearly CBC.   Monitoring hypothyroidism - denies hypothyroid symptoms besides difficulty losing weight and cold intolerance.   Weight - watching portion sizes with some weight loss noted. Has largely cut out caffeine. No regular exercise routine  Psoriasis- about to start [facility]([place]).    Preventative: Flu shot yearly COVID vaccine - discussed, declines  Tdap 10/2015  Seat belt use discussed.  Sunscreen use discussed. No changing moles on skin.  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 Walking 1 mile 4-5 times per week, also has elliptical he can use  Diet: good water, vegetables daily      Relevant past medical, surgical, family and social history reviewed and updated as indicated. Interim medical history since our last visit reviewed. Allergies and  medications reviewed and updated. Outpatient Medications Prior to Visit  Medication Sig Dispense Refill   augmented betamethasone dipropionate (DIPROLENE-AF) 0.05 % cream APPLY TO AFFECTED AREA ON ELBOWS TWICE A DAY UNTIL CLEAR, THEN USE AS NEEDED     calcipotriene (DOVONOX) 0.005 % cream APPLY ON THE SKIN AS DIRECTED TWICE DAILY TO FACE AND FOLD OF SKIN UNTIL CLEAR THEN AS NEEDED     fluticasone (FLONASE) 50 MCG/ACT nasal spray SPRAY 2 SPRAYS INTO EACH NOSTRIL EVERY DAY 16 mL 2   hydrOXYzine (ATARAX) 25 MG tablet TAKE 0.5-1 TABLETS (12.5-25 MG TOTAL) BY MOUTH 2 (TWO) TIMES DAILY AS NEEDED FOR ANXIETY. 180 tablet 2   ketoconazole (NIZORAL) 2 % shampoo APPLY TO AFFECTED AREA EVERY DAY     metoprolol tartrate (LOPRESSOR) 50 MG tablet Take 1 tablet (50 mg total) by mouth 2 (two) times daily. 180 tablet 1   Multiple Vitamin (MULTIVITAMIN ADULT) TABS Take 1 tablet by mouth daily.     Probiotic Product (PROBIOTIC PO) Take 2 tablets by mouth daily before breakfast.     famotidine (PEPCID) 20 MG tablet Take 1 tablet (20 mg total) by mouth at bedtime.     levocetirizine (XYZAL) 5 MG tablet Take 5 mg by mouth every evening.     levothyroxine (SYNTHROID) 50 MCG tablet Take 1 tablet (50 mcg total) by mouth daily. 90 tablet 1   OTEZLA 30 MG TABS  Take 1 tablet by mouth 2 (two) times daily. (Patient not taking: Reported on 06/18/2021)     No facility-administered medications prior to visit.     Per HPI unless specifically indicated in ROS section below Review of Systems  Constitutional:  Positive for fever. Negative for activity change, appetite change, chills, fatigue and unexpected weight change.  HENT:  Negative for hearing loss.   Eyes:  Negative for visual disturbance.  Respiratory:  Negative for cough, chest tightness, shortness of breath and wheezing.   Cardiovascular:  Positive for palpitations. Negative for chest pain and leg swelling.  Gastrointestinal:  Positive for diarrhea (mild). Negative for  abdominal distention, abdominal pain, blood in stool, constipation, nausea and vomiting.  Genitourinary:  Negative for difficulty urinating and hematuria.  Musculoskeletal:  Positive for arthralgias. Negative for myalgias and neck pain.       Recent body aches  Skin:  Negative for rash.  Neurological:  Positive for headaches. Negative for dizziness, seizures and syncope.  Hematological:  Negative for adenopathy. Does not bruise/bleed easily.  Psychiatric/Behavioral:  Negative for dysphoric mood. The patient is not nervous/anxious.    Objective:  BP 124/76   Pulse 66   Temp 97.6 F (36.4 C) (Temporal)   Ht 5' 8.75" (1.746 m)   Wt 286 lb 4 oz (129.8 kg)   SpO2 98%   BMI 42.58 kg/m   Wt Readings from Last 3 Encounters:  06/18/21 286 lb 4 oz (129.8 kg)  06/08/21 290 lb (131.5 kg)  06/05/21 290 lb (131.5 kg)      Physical Exam Vitals and nursing note reviewed.  Constitutional:      General: He is not in acute distress.    Appearance: Normal appearance. He is well-developed. He is not ill-appearing.  HENT:     Head: Normocephalic and atraumatic.     Right Ear: Hearing, tympanic membrane, ear canal and external ear normal.     Left Ear: Hearing, tympanic membrane, ear canal and external ear normal.  Eyes:     General: No scleral icterus.    Extraocular Movements: Extraocular movements intact.     Conjunctiva/sclera: Conjunctivae normal.     Pupils: Pupils are equal, round, and reactive to light.  Neck:     Thyroid: No thyroid mass or thyromegaly.  Cardiovascular:     Rate and Rhythm: Normal rate and regular rhythm.     Pulses: Normal pulses.          Radial pulses are 2+ on the right side and 2+ on the left side.     Heart sounds: Normal heart sounds. No murmur heard. Pulmonary:     Effort: Pulmonary effort is normal. No respiratory distress.     Breath sounds: Normal breath sounds. No wheezing, rhonchi or rales.  Abdominal:     General: Bowel sounds are normal. There is no  distension.     Palpations: Abdomen is soft. There is no mass.     Tenderness: There is no abdominal tenderness. There is no guarding or rebound.     Hernia: No hernia is present.  Musculoskeletal:        General: Normal range of motion.     Cervical back: Normal range of motion and neck supple.     Right lower leg: No edema.     Left lower leg: No edema.  Lymphadenopathy:     Cervical: No cervical adenopathy.  Skin:    General: Skin is warm and dry.     Findings:  No rash.  Neurological:     General: No focal deficit present.     Mental Status: He is alert and oriented to person, place, and time.  Psychiatric:        Mood and Affect: Mood normal.        Behavior: Behavior normal.        Thought Content: Thought content normal.        Judgment: Judgment normal.      Results for orders placed or performed in visit on 06/07/21  Basic metabolic panel  Result Value Ref Range   Sodium 135 135 - 145 mEq/L   Potassium 4.3 3.5 - 5.1 mEq/L   Chloride 99 96 - 112 mEq/L   CO2 31 19 - 32 mEq/L   Glucose, Bld 91 70 - 99 mg/dL   BUN 8 6 - 23 mg/dL   Creatinine, Ser 0.17 0.40 - 1.50 mg/dL   GFR 510.25 >85.27 mL/min   Calcium 9.4 8.4 - 10.5 mg/dL  CBC with Differential  Result Value Ref Range   WBC 6.3 4.0 - 10.5 K/uL   RBC 4.65 4.22 - 5.81 Mil/uL   Hemoglobin 12.9 (L) 13.0 - 17.0 g/dL   HCT 78.2 (L) 42.3 - 53.6 %   MCV 82.6 78.0 - 100.0 fl   MCHC 33.7 30.0 - 36.0 g/dL   RDW 14.4 31.5 - 40.0 %   Platelets 136.0 (L) 150.0 - 400.0 K/uL   Neutrophils Relative % 40.7 (L) 43.0 - 77.0 %   Lymphocytes Relative 44.2 12.0 - 46.0 %   Monocytes Relative 9.2 3.0 - 12.0 %   Eosinophils Relative 5.3 (H) 0.0 - 5.0 %   Basophils Relative 0.6 0.0 - 3.0 %   Neutro Abs 2.5 1.4 - 7.7 K/uL   Lymphs Abs 2.8 0.7 - 4.0 K/uL   Monocytes Absolute 0.6 0.1 - 1.0 K/uL   Eosinophils Absolute 0.3 0.0 - 0.7 K/uL   Basophils Absolute 0.0 0.0 - 0.1 K/uL   Lab Results  Component Value Date   TSH 2.43  06/04/2021    Assessment & Plan:   Problem List Items Addressed This Visit     Health maintenance examination - Primary (Chronic)    Preventative protocols reviewed and updated unless pt declined. Discussed healthy diet and lifestyle.        Perennial allergic rhinitis    Continue xyzal and flonase       Obesity, morbid, BMI 40.0-49.9 (HCC)    Encouraged healthy diet and lifestyle changes to affect sustainable weight loss.         Psoriasis    Followed by dermatologist Dr Doreen Beam, planning to start [facility].        Essential hypertension    Chronic, stable on BB regimen.        Eosinophilia    Stable period, recent levels normal.        Relevant Orders   CBC with Differential/Platelet   GERD (gastroesophageal reflux disease)    Stable period on pepcid 20mg  nightly.        Relevant Medications   famotidine (PEPCID) 20 MG tablet   Palpitations    Recently saw cardiology with stable evaluation, ?PVST.        Mixed hyperlipidemia    Update FLP off medication       Hypothyroidism    Chronic, stable. Continue levothyroxine daily.        Relevant Medications   levothyroxine (SYNTHROID) 50 MCG tablet   Thrombocytopenia (HCC)    ?  related to recent presumed viral illness. Update CBC today.        Relevant Orders   CBC with Differential/Platelet   Other Visit Diagnoses     Need for hepatitis C screening test       Encounter for screening for HIV       Relevant Orders   HIV Antibody (routine testing w rflx)        Meds ordered this encounter  Medications   levocetirizine (XYZAL) 5 MG tablet    Sig: Take 1 tablet (5 mg total) by mouth every evening.    Dispense:  90 tablet    Refill:  3   famotidine (PEPCID) 20 MG tablet    Sig: Take 1 tablet (20 mg total) by mouth at bedtime.    Dispense:  90 tablet    Refill:  3   levothyroxine (SYNTHROID) 50 MCG tablet    Sig: Take 1 tablet (50 mcg total) by mouth daily.    Dispense:  90 tablet     Refill:  3   Orders Placed This Encounter  Procedures   HIV Antibody (routine testing w rflx)   CBC with Differential/Platelet     Patient instructions: Labs today  Good to see you today Return as needed or in 1 year for next physical.   Follow up plan: Return in about 1 year (around 06/19/2022) for annual exam, prior fasting for blood work.  Eustaquio Boyden, MD

## 2021-06-18 NOTE — Assessment & Plan Note (Signed)
Encouraged healthy diet and lifestyle changes to affect sustainable weight loss.  

## 2021-06-19 LAB — HIV ANTIBODY (ROUTINE TESTING W REFLEX): HIV 1&2 Ab, 4th Generation: NONREACTIVE

## 2021-06-19 LAB — HEPATITIS C ANTIBODY
Hepatitis C Ab: NONREACTIVE
SIGNAL TO CUT-OFF: 0.05 (ref <1.00)

## 2021-09-16 ENCOUNTER — Other Ambulatory Visit: Payer: Self-pay | Admitting: Family Medicine

## 2021-09-17 ENCOUNTER — Other Ambulatory Visit: Payer: Self-pay | Admitting: Family Medicine

## 2021-12-11 ENCOUNTER — Ambulatory Visit: Payer: Managed Care, Other (non HMO) | Admitting: Nurse Practitioner

## 2022-06-20 ENCOUNTER — Other Ambulatory Visit: Payer: Self-pay | Admitting: Family Medicine

## 2022-06-20 DIAGNOSIS — K219 Gastro-esophageal reflux disease without esophagitis: Secondary | ICD-10-CM

## 2022-06-20 DIAGNOSIS — J3089 Other allergic rhinitis: Secondary | ICD-10-CM

## 2022-06-20 NOTE — Telephone Encounter (Signed)
E-scribed refills.   Plz schedule CPE and fasting (no food/drink- except water and/or blk coffee 5 hrs prior) lab visits for additional refills.

## 2022-06-20 NOTE — Telephone Encounter (Signed)
Noted  

## 2022-06-20 NOTE — Telephone Encounter (Signed)
Patient has been scheduled

## 2022-06-21 ENCOUNTER — Other Ambulatory Visit: Payer: Self-pay | Admitting: Family Medicine

## 2022-06-21 DIAGNOSIS — E039 Hypothyroidism, unspecified: Secondary | ICD-10-CM

## 2022-08-16 ENCOUNTER — Other Ambulatory Visit: Payer: Self-pay | Admitting: Family Medicine

## 2022-08-16 DIAGNOSIS — I10 Essential (primary) hypertension: Secondary | ICD-10-CM

## 2022-08-29 ENCOUNTER — Encounter (INDEPENDENT_AMBULATORY_CARE_PROVIDER_SITE_OTHER): Payer: Self-pay

## 2022-09-16 ENCOUNTER — Other Ambulatory Visit: Payer: Self-pay | Admitting: Family Medicine

## 2022-09-16 DIAGNOSIS — J3089 Other allergic rhinitis: Secondary | ICD-10-CM

## 2022-09-16 DIAGNOSIS — K219 Gastro-esophageal reflux disease without esophagitis: Secondary | ICD-10-CM

## 2022-09-23 ENCOUNTER — Encounter: Payer: Managed Care, Other (non HMO) | Admitting: Family Medicine

## 2022-10-04 ENCOUNTER — Encounter: Payer: Managed Care, Other (non HMO) | Admitting: Family Medicine

## 2022-10-18 ENCOUNTER — Encounter: Payer: Self-pay | Admitting: Family Medicine

## 2022-10-18 ENCOUNTER — Ambulatory Visit (INDEPENDENT_AMBULATORY_CARE_PROVIDER_SITE_OTHER): Payer: Managed Care, Other (non HMO) | Admitting: Family Medicine

## 2022-10-18 VITALS — BP 130/76 | HR 67 | Temp 99.1°F | Ht 68.5 in | Wt 299.4 lb

## 2022-10-18 DIAGNOSIS — E782 Mixed hyperlipidemia: Secondary | ICD-10-CM

## 2022-10-18 DIAGNOSIS — J3089 Other allergic rhinitis: Secondary | ICD-10-CM

## 2022-10-18 DIAGNOSIS — L409 Psoriasis, unspecified: Secondary | ICD-10-CM

## 2022-10-18 DIAGNOSIS — I10 Essential (primary) hypertension: Secondary | ICD-10-CM

## 2022-10-18 DIAGNOSIS — Z0000 Encounter for general adult medical examination without abnormal findings: Secondary | ICD-10-CM | POA: Diagnosis not present

## 2022-10-18 DIAGNOSIS — D721 Eosinophilia, unspecified: Secondary | ICD-10-CM | POA: Diagnosis not present

## 2022-10-18 DIAGNOSIS — K219 Gastro-esophageal reflux disease without esophagitis: Secondary | ICD-10-CM | POA: Diagnosis not present

## 2022-10-18 DIAGNOSIS — G4733 Obstructive sleep apnea (adult) (pediatric): Secondary | ICD-10-CM

## 2022-10-18 DIAGNOSIS — E039 Hypothyroidism, unspecified: Secondary | ICD-10-CM | POA: Diagnosis not present

## 2022-10-18 LAB — COMPREHENSIVE METABOLIC PANEL
ALT: 56 U/L — ABNORMAL HIGH (ref 0–53)
AST: 35 U/L (ref 0–37)
Albumin: 4.6 g/dL (ref 3.5–5.2)
Alkaline Phosphatase: 81 U/L (ref 39–117)
BUN: 14 mg/dL (ref 6–23)
CO2: 28 meq/L (ref 19–32)
Calcium: 9.8 mg/dL (ref 8.4–10.5)
Chloride: 100 meq/L (ref 96–112)
Creatinine, Ser: 0.92 mg/dL (ref 0.40–1.50)
GFR: 108.7 mL/min (ref >60.00)
Glucose, Bld: 92 mg/dL (ref 70–99)
Potassium: 4.6 meq/L (ref 3.5–5.1)
Sodium: 137 meq/L (ref 135–145)
Total Bilirubin: 0.6 mg/dL (ref 0.2–1.2)
Total Protein: 7.6 g/dL (ref 6.0–8.3)

## 2022-10-18 LAB — CBC WITH DIFFERENTIAL/PLATELET
Basophils Absolute: 0 10*3/uL (ref 0.0–0.1)
Basophils Relative: 0.6 % (ref 0.0–3.0)
Eosinophils Absolute: 1.8 10*3/uL — ABNORMAL HIGH (ref 0.0–0.7)
Eosinophils Relative: 23.3 % — ABNORMAL HIGH (ref 0.0–5.0)
HCT: 42.5 % (ref 39.0–52.0)
Hemoglobin: 13.9 g/dL (ref 13.0–17.0)
Lymphocytes Relative: 18.4 % (ref 12.0–46.0)
Lymphs Abs: 1.5 10*3/uL (ref 0.7–4.0)
MCHC: 32.8 g/dL (ref 30.0–36.0)
MCV: 85.5 fL (ref 78.0–100.0)
Monocytes Absolute: 0.5 10*3/uL (ref 0.1–1.0)
Monocytes Relative: 6.7 % (ref 3.0–12.0)
Neutro Abs: 4 10*3/uL (ref 1.4–7.7)
Neutrophils Relative %: 51 % (ref 43.0–77.0)
Platelets: 269 10*3/uL (ref 150.0–400.0)
RBC: 4.97 Mil/uL (ref 4.22–5.81)
RDW: 13.4 % (ref 11.5–15.5)
WBC: 7.9 10*3/uL (ref 4.0–10.5)

## 2022-10-18 LAB — LIPID PANEL
Cholesterol: 184 mg/dL (ref 0–200)
HDL: 37.5 mg/dL — ABNORMAL LOW (ref >39.00)
LDL Cholesterol: 117 mg/dL — ABNORMAL HIGH (ref 0–99)
NonHDL: 146.22
Total CHOL/HDL Ratio: 5
Triglycerides: 144 mg/dL (ref 0.0–149.0)
VLDL: 28.8 mg/dL (ref 0.0–40.0)

## 2022-10-18 LAB — TSH: TSH: 2.14 u[IU]/mL (ref 0.35–5.50)

## 2022-10-18 MED ORDER — LEVOCETIRIZINE DIHYDROCHLORIDE 5 MG PO TABS: 5.0000 mg | ORAL_TABLET | Freq: Every evening | ORAL | 4 refills | Status: DC

## 2022-10-18 MED ORDER — LEVOTHYROXINE SODIUM 50 MCG PO TABS: 50.0000 ug | ORAL_TABLET | Freq: Every day | ORAL | 1 refills | Status: DC

## 2022-10-18 MED ORDER — FLUTICASONE PROPIONATE 50 MCG/ACT NA SUSP: 2.0000 | Freq: Every day | NASAL | 4 refills | Status: AC

## 2022-10-18 MED ORDER — FAMOTIDINE 20 MG PO TABS: 20.0000 mg | ORAL_TABLET | Freq: Every day | ORAL | 4 refills | Status: DC

## 2022-10-18 MED ORDER — METOPROLOL TARTRATE 50 MG PO TABS: 50.0000 mg | ORAL_TABLET | Freq: Two times a day (BID) | ORAL | 4 refills | Status: DC

## 2022-10-18 NOTE — Progress Notes (Signed)
Ph: [phone] Fax: [phone]   Patient ID: Micheal [name], male    DOB: 1990-02-27, 32 y.o.   MRN: [id number]  This visit was conducted in person.  BP 130/76   Pulse 67   Temp 99.1 F (37.3 C) (Oral)   Ht 5' 8.5" (1.74 m)   Wt 299 lb 6 oz (135.8 kg)   SpO2 99%   BMI 44.86 kg/m    CC: CPE Subjective:   HPI: Micheal [name] is a 32 y.o. male presenting on 10/18/2022 for Annual Exam   Got married 1 yr ago.   Saw cardiology Dr. Okey Dupre for possible viral induced SVT - continues metoprolol 50 mg twice daily with additional half to 1 tablet as needed for recurrent palpitations.   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    Eosinophilia was seen by Dr Orlie Dakin - reassuring evaluation rec yearly CBC.   Psoriasis- sees derm on Diprolene AF and calcipotriene and Otezla ([facility]).   Peak weight 318 lbs, has lost 20 lbs since going to gym more regularly  Preventative: Flu shot yearly through work COVID vaccine - declines  Tdap 10/2015  Seat belt use discussed.  Sunscreen use discussed. No changing moles on skin.  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 Interim medical history since our last visit reviewed. Allergies and medications reviewed and updated. Outpatient Medications Prior to Visit  Medication Sig Dispense Refill   augmented betamethasone dipropionate (DIPROLENE-AF) 0.05 % cream APPLY TO AFFECTED AREA ON ELBOWS TWICE A DAY UNTIL CLEAR, THEN USE AS NEEDED     calcipotriene (DOVONOX) 0.005 % cream APPLY ON THE SKIN AS DIRECTED TWICE DAILY TO FACE AND FOLD OF SKIN  UNTIL CLEAR THEN AS NEEDED     ketoconazole (NIZORAL) 2 % shampoo APPLY TO AFFECTED AREA EVERY DAY     Multiple Vitamin (MULTIVITAMIN ADULT) TABS Take 1 tablet by mouth daily.     OTEZLA 30 MG TABS Take 1 tablet by mouth 2 (two) times daily. (Patient not taking: Reported on 06/18/2021)     Probiotic Product (PROBIOTIC PO) Take 2 tablets by mouth daily before breakfast.     famotidine (PEPCID) 20 MG tablet TAKE 1 TABLET BY MOUTH EVERYDAY AT BEDTIME 30 tablet 0   fluticasone (FLONASE) 50 MCG/ACT nasal spray SPRAY 2 SPRAYS INTO EACH NOSTRIL EVERY DAY 48 mL 0   hydrOXYzine (ATARAX) 25 MG tablet TAKE 0.5-1 TABLETS (12.5-25 MG TOTAL) BY MOUTH 2 (TWO) TIMES DAILY AS NEEDED FOR ANXIETY. 180 tablet 2   levocetirizine (XYZAL) 5 MG tablet TAKE 1 TABLET BY MOUTH EVERY DAY IN THE EVENING 30 tablet 0   levothyroxine (SYNTHROID) 50 MCG tablet TAKE 1 TABLET BY MOUTH EVERY DAY 90 tablet 1   metoprolol tartrate (LOPRESSOR) 50 MG tablet TAKE 1 TABLET BY MOUTH TWICE A DAY 180 tablet 0   No facility-administered medications prior to visit.     Per HPI unless specifically indicated in ROS section below Review of Systems  Constitutional:  Negative for activity change, appetite change, chills, fatigue, fever and unexpected  weight change.  HENT:  Negative for hearing loss.   Eyes:  Negative for visual disturbance.  Respiratory:  Negative for cough, chest tightness, shortness of breath and wheezing.   Cardiovascular:  Negative for chest pain, palpitations and leg swelling.  Gastrointestinal:  Negative for abdominal distention, abdominal pain, blood in stool, constipation, diarrhea, nausea and vomiting.  Genitourinary:  Negative for difficulty urinating and hematuria.  Musculoskeletal:  Negative for arthralgias, myalgias and neck pain.  Skin:  Negative for rash.  Neurological:  Negative for dizziness, seizures, syncope and headaches.  Hematological:  Negative for adenopathy. Does not bruise/bleed easily.   Psychiatric/Behavioral:  Negative for dysphoric mood. The patient is not nervous/anxious.     Objective:  BP 130/76   Pulse 67   Temp 99.1 F (37.3 C) (Oral)   Ht 5' 8.5" (1.74 m)   Wt 299 lb 6 oz (135.8 kg)   SpO2 99%   BMI 44.86 kg/m   Wt Readings from Last 3 Encounters:  10/18/22 299 lb 6 oz (135.8 kg)  06/18/21 286 lb 4 oz (129.8 kg)  06/08/21 290 lb (131.5 kg)      Physical Exam Vitals and nursing note reviewed.  Constitutional:      General: He is not in acute distress.    Appearance: Normal appearance. He is well-developed. He is not ill-appearing.  HENT:     Head: Normocephalic and atraumatic.     Right Ear: Hearing, tympanic membrane, ear canal and external ear normal.     Left Ear: Hearing, tympanic membrane, ear canal and external ear normal.     Mouth/Throat:     Mouth: Mucous membranes are moist.     Pharynx: Oropharynx is clear. No oropharyngeal exudate or posterior oropharyngeal erythema.  Eyes:     General: No scleral icterus.    Extraocular Movements: Extraocular movements intact.     Conjunctiva/sclera: Conjunctivae normal.     Pupils: Pupils are equal, round, and reactive to light.  Neck:     Thyroid: No thyroid mass or thyromegaly.  Cardiovascular:     Rate and Rhythm: Normal rate and regular rhythm.     Pulses: Normal pulses.          Radial pulses are 2+ on the right side and 2+ on the left side.     Heart sounds: Normal heart sounds. No murmur heard. Pulmonary:     Effort: Pulmonary effort is normal. No respiratory distress.     Breath sounds: Normal breath sounds. No wheezing, rhonchi or rales.  Abdominal:     General: Bowel sounds are normal. There is no distension.     Palpations: Abdomen is soft. There is no mass.     Tenderness: There is no abdominal tenderness. There is no guarding or rebound.     Hernia: No hernia is present.  Musculoskeletal:        General: Normal range of motion.     Cervical back: Normal range of motion and neck  supple.     Right lower leg: No edema.     Left lower leg: No edema.  Lymphadenopathy:     Cervical: No cervical adenopathy.  Skin:    General: Skin is warm and dry.     Findings: No rash.  Neurological:     General: No focal deficit present.     Mental Status: He is alert and oriented to person, place, and time.  Psychiatric:        Mood and Affect: Mood normal.  Behavior: Behavior normal.        Thought Content: Thought content normal.        Judgment: Judgment normal.       Results for orders placed or performed in visit on 06/18/21  Hepatitis C antibody  Result Value Ref Range   Hepatitis C Ab NON-REACTIVE NON-REACTIVE   SIGNAL TO CUT-OFF 0.05 <1.00  Lipid panel  Result Value Ref Range   Cholesterol 160 0 - 200 mg/dL   Triglycerides 147.8 (H) 0.0 - 149.0 mg/dL   HDL 29.56 (L) >21.30 mg/dL   VLDL 86.5 0.0 - 78.4 mg/dL   LDL Cholesterol 88 0 - 99 mg/dL   Total CHOL/HDL Ratio 5    NonHDL 124.94   HIV Antibody (routine testing w rflx)  Result Value Ref Range   HIV 1&2 Ab, 4th Generation NON-REACTIVE NON-REACTIVE  CBC with Differential/Platelet  Result Value Ref Range   WBC 5.7 4.0 - 10.5 K/uL   RBC 4.79 4.22 - 5.81 Mil/uL   Hemoglobin 13.5 13.0 - 17.0 g/dL   HCT 69.6 29.5 - 28.4 %   MCV 83.7 78.0 - 100.0 fl   MCHC 33.6 30.0 - 36.0 g/dL   RDW 13.2 44.0 - 10.2 %   Platelets 263.0 150.0 - 400.0 K/uL   Neutrophils Relative % 49.8 43.0 - 77.0 %   Lymphocytes Relative 28.8 12.0 - 46.0 %   Monocytes Relative 7.1 3.0 - 12.0 %   Eosinophils Relative 13.3 (H) 0.0 - 5.0 %   Basophils Relative 1.0 0.0 - 3.0 %   Neutro Abs 2.8 1.4 - 7.7 K/uL   Lymphs Abs 1.6 0.7 - 4.0 K/uL   Monocytes Absolute 0.4 0.1 - 1.0 K/uL   Eosinophils Absolute 0.8 (H) 0.0 - 0.7 K/uL   Basophils Absolute 0.1 0.0 - 0.1 K/uL    Assessment & Plan:   Problem List Items Addressed This Visit     Health maintenance examination - Primary (Chronic)    Preventative protocols reviewed and updated  unless pt declined. Discussed healthy diet and lifestyle.       Perennial allergic rhinitis   Relevant Medications   fluticasone (FLONASE) 50 MCG/ACT nasal spray   levocetirizine (XYZAL) 5 MG tablet   Obesity, morbid, BMI 40.0-49.9 (HCC)    Encouraged healthy diet and lifestyle choices to affect sustainable weight loss.       Psoriasis    Appreciate derm  care      OSA on CPAP    Continues nightly CPAP at pressure setting of 12  Last saw Dr Belia Heman 04/2021.       Essential hypertension    Stable period on metoprolol.       Relevant Medications   metoprolol tartrate (LOPRESSOR) 50 MG tablet   Eosinophilia    Update labs.       Relevant Orders   CBC with Differential/Platelet   GERD (gastroesophageal reflux disease)    Stable period on pepcid 20mg  daily.       Relevant Medications   famotidine (PEPCID) 20 MG tablet   Mixed hyperlipidemia    Chronic, update FLP off medications. The ASCVD Risk score (Arnett DK, et al., 2019) failed to calculate for the following reasons:   The 2019 ASCVD risk score is only valid for ages 14 to 31       Relevant Medications   metoprolol tartrate (LOPRESSOR) 50 MG tablet   Other Relevant Orders   Lipid panel   Comprehensive metabolic panel   Hypothyroidism  Chronic, stable on low dose levothyroxine - update labs.       Relevant Medications   levothyroxine (SYNTHROID) 50 MCG tablet   metoprolol tartrate (LOPRESSOR) 50 MG tablet   Other Relevant Orders   TSH     Meds ordered this encounter  Medications   famotidine (PEPCID) 20 MG tablet    Sig: Take 1 tablet (20 mg total) by mouth at bedtime.    Dispense:  90 tablet    Refill:  4   fluticasone (FLONASE) 50 MCG/ACT nasal spray    Sig: Place 2 sprays into both nostrils daily.    Dispense:  48 mL    Refill:  4   levocetirizine (XYZAL) 5 MG tablet    Sig: Take 1 tablet (5 mg total) by mouth every evening.    Dispense:  90 tablet    Refill:  4   levothyroxine (SYNTHROID) 50  MCG tablet    Sig: Take 1 tablet (50 mcg total) by mouth daily.    Dispense:  90 tablet    Refill:  1   metoprolol tartrate (LOPRESSOR) 50 MG tablet    Sig: Take 1 tablet (50 mg total) by mouth 2 (two) times daily.    Dispense:  180 tablet    Refill:  4    Orders Placed This Encounter  Procedures   Lipid panel   Comprehensive metabolic panel   CBC with Differential/Platelet   TSH    Patient Instructions  Labs today    Follow up plan: No follow-ups on file.  Eustaquio Boyden, MD

## 2022-10-18 NOTE — Assessment & Plan Note (Signed)
Appreciate derm care.

## 2022-10-18 NOTE — Patient Instructions (Addendum)
Labs today  Good to see you today  Continue current medicines. Return as needed or in 1 year for next physical.

## 2022-10-18 NOTE — Assessment & Plan Note (Addendum)
Continues nightly CPAP at pressure setting of 12  Last saw Dr Belia Heman 04/2021.

## 2022-10-18 NOTE — Assessment & Plan Note (Addendum)
Chronic, stable on low dose levothyroxine - update labs.

## 2022-10-18 NOTE — Assessment & Plan Note (Signed)
Chronic, update FLP off medications. The ASCVD Risk score (Arnett DK, et al., 2019) failed to calculate for the following reasons:   The 2019 ASCVD risk score is only valid for ages 34 to 32

## 2022-10-18 NOTE — Assessment & Plan Note (Signed)
Stable period on pepcid 20mg  daily.

## 2022-10-18 NOTE — Assessment & Plan Note (Signed)
Update labs.  

## 2022-10-18 NOTE — Assessment & Plan Note (Addendum)
Encouraged healthy diet and lifestyle choices to affect sustainable weight loss.  ?

## 2022-10-18 NOTE — Assessment & Plan Note (Signed)
Stable period on metoprolol.

## 2022-10-18 NOTE — Assessment & Plan Note (Signed)
Preventative protocols reviewed and updated unless pt declined. Discussed healthy diet and lifestyle.  

## 2022-10-21 ENCOUNTER — Other Ambulatory Visit: Payer: Self-pay | Admitting: Family Medicine

## 2022-10-21 DIAGNOSIS — D721 Eosinophilia, unspecified: Secondary | ICD-10-CM

## 2023-01-19 ENCOUNTER — Encounter (INDEPENDENT_AMBULATORY_CARE_PROVIDER_SITE_OTHER): Payer: Managed Care, Other (non HMO) | Admitting: Family Medicine

## 2023-01-19 DIAGNOSIS — E039 Hypothyroidism, unspecified: Secondary | ICD-10-CM

## 2023-01-19 DIAGNOSIS — R7401 Elevation of levels of liver transaminase levels: Secondary | ICD-10-CM

## 2023-01-20 DIAGNOSIS — E039 Hypothyroidism, unspecified: Secondary | ICD-10-CM

## 2023-01-20 DIAGNOSIS — R7401 Elevation of levels of liver transaminase levels: Secondary | ICD-10-CM | POA: Diagnosis not present

## 2023-01-20 NOTE — Telephone Encounter (Signed)
Please see the MyChart message reply(ies) for my assessment and plan.  The patient gave consent for this Medical Advice Message and is aware that it may result in a bill to their insurance company as well as the possibility that this may result in a co-payment or deductible. They are an established patient, but are not seeking medical advice exclusively about a problem treated during an in person or video visit in the last 7 days. I did not recommend an in person or video visit within 7 days of my reply.  I spent a total of 7 minutes cumulative time within 7 days through MyChart messaging Eustaquio Boyden, MD

## 2023-01-21 ENCOUNTER — Other Ambulatory Visit (INDEPENDENT_AMBULATORY_CARE_PROVIDER_SITE_OTHER): Payer: Managed Care, Other (non HMO)

## 2023-01-21 DIAGNOSIS — D721 Eosinophilia, unspecified: Secondary | ICD-10-CM

## 2023-01-21 DIAGNOSIS — E039 Hypothyroidism, unspecified: Secondary | ICD-10-CM

## 2023-01-21 DIAGNOSIS — R7401 Elevation of levels of liver transaminase levels: Secondary | ICD-10-CM

## 2023-01-21 LAB — CBC WITH DIFFERENTIAL/PLATELET
Basophils Absolute: 0.1 10*3/uL (ref 0.0–0.1)
Basophils Relative: 0.7 % (ref 0.0–3.0)
Eosinophils Absolute: 1.2 10*3/uL — ABNORMAL HIGH (ref 0.0–0.7)
Eosinophils Relative: 14.1 % — ABNORMAL HIGH (ref 0.0–5.0)
HCT: 43.7 % (ref 39.0–52.0)
Hemoglobin: 14.4 g/dL (ref 13.0–17.0)
Lymphocytes Relative: 25.2 % (ref 12.0–46.0)
Lymphs Abs: 2.2 10*3/uL (ref 0.7–4.0)
MCHC: 33 g/dL (ref 30.0–36.0)
MCV: 85.2 fL (ref 78.0–100.0)
Monocytes Absolute: 0.7 10*3/uL (ref 0.1–1.0)
Monocytes Relative: 7.6 % (ref 3.0–12.0)
Neutro Abs: 4.5 10*3/uL (ref 1.4–7.7)
Neutrophils Relative %: 52.4 % (ref 43.0–77.0)
Platelets: 271 10*3/uL (ref 150.0–400.0)
RBC: 5.13 Mil/uL (ref 4.22–5.81)
RDW: 12.9 % (ref 11.5–15.5)
WBC: 8.7 10*3/uL (ref 4.0–10.5)

## 2023-01-21 LAB — HEPATIC FUNCTION PANEL
ALT: 62 U/L — ABNORMAL HIGH (ref 0–53)
AST: 27 U/L (ref 0–37)
Albumin: 4.8 g/dL (ref 3.5–5.2)
Alkaline Phosphatase: 87 U/L (ref 39–117)
Bilirubin, Direct: 0.1 mg/dL (ref 0.0–0.3)
Total Bilirubin: 0.6 mg/dL (ref 0.2–1.2)
Total Protein: 7.7 g/dL (ref 6.0–8.3)

## 2023-01-21 LAB — TSH: TSH: 3.34 u[IU]/mL (ref 0.35–5.50)

## 2023-01-24 ENCOUNTER — Encounter: Payer: Self-pay | Admitting: Family Medicine

## 2023-02-08 ENCOUNTER — Other Ambulatory Visit: Payer: Self-pay | Admitting: Family Medicine

## 2023-02-08 DIAGNOSIS — J3089 Other allergic rhinitis: Secondary | ICD-10-CM

## 2023-02-13 ENCOUNTER — Ambulatory Visit: Payer: Managed Care, Other (non HMO) | Admitting: Internal Medicine

## 2023-02-13 ENCOUNTER — Encounter: Payer: Self-pay | Admitting: Internal Medicine

## 2023-02-13 VITALS — BP 130/68 | HR 66 | Temp 97.7°F | Ht 68.5 in | Wt 306.4 lb

## 2023-02-13 DIAGNOSIS — G4733 Obstructive sleep apnea (adult) (pediatric): Secondary | ICD-10-CM | POA: Diagnosis not present

## 2023-02-13 NOTE — Patient Instructions (Addendum)
Continue auto CPAP as prescribed Excellent Job A+  Continue auto CPAP as prescribed will change to AUTO CPAP 5-12 cmH20 Referral to new DME company for mask, new machine and supplies   Avoid Allergens and Irritants Avoid secondhand smoke Avoid SICK contacts Recommend  Masking  when appropriate Recommend Keep up-to-date with vaccinations  Recommend weight loss

## 2023-02-13 NOTE — Progress Notes (Signed)
[facility] Consultation       CHIEF COMPLAINT:   Follow-up assessment for OSA   HISTORY OF PRESENT ILLNESS  Diagnosed with obstructive sleep apnea 2018 Patient with severe sleep apnea with AHI of 92 In 2018 he was treated with CPAP 5-20 Last previous compliance report showed well-controlled sleep apnea  No exacerbation at this time No evidence of heart failure at this time No evidence or signs of infection at this time No respiratory distress No fevers, chills, nausea, vomiting, diarrhea No evidence of lower extremity edema No evidence hemoptysis   Current CPAP download April 2023 Excellent compliance 100% for days 100% for greater than 4 hours AHI reduced to 2.5 Auto CPAP 5 to 15 cm of water pressure   Current weight 306 pounds  Recommend weight loss Works At home for [employer]   Compliance report reviewed in detail for the last 1 month Excellent compliance report AHI reduced to 3.3 Sleep apnea is well-controlled We will continue current prescription Auto CPAP 5-12 Current CPAP download  Current Medication:  Current Outpatient Medications:    augmented betamethasone dipropionate (DIPROLENE-AF) 0.05 % cream, APPLY TO AFFECTED AREA ON ELBOWS TWICE A DAY UNTIL CLEAR, THEN USE AS NEEDED, Disp: , Rfl:    calcipotriene (DOVONOX) 0.005 % cream, APPLY ON THE SKIN AS DIRECTED TWICE DAILY TO FACE AND FOLD OF SKIN UNTIL CLEAR THEN AS NEEDED, Disp: , Rfl:    famotidine (PEPCID) 20 MG tablet, Take 1 tablet (20 mg total) by mouth at bedtime., Disp: 90 tablet, Rfl: 4   fluticasone (FLONASE) 50 MCG/ACT nasal spray, Place 2 sprays into both nostrils daily., Disp: 48 mL, Rfl: 4   ketoconazole (NIZORAL) 2 % shampoo, APPLY TO AFFECTED AREA EVERY DAY, Disp: , Rfl:    levocetirizine (XYZAL) 5 MG tablet, TAKE 1 TABLET BY MOUTH EVERY DAY IN THE EVENING, Disp: 30 tablet, Rfl: 3   levothyroxine (SYNTHROID) 50 MCG tablet, Take 1 tablet (50 mcg total) by mouth daily.,  Disp: 90 tablet, Rfl: 1   metoprolol tartrate (LOPRESSOR) 50 MG tablet, Take 1 tablet (50 mg total) by mouth 2 (two) times daily., Disp: 180 tablet, Rfl: 4   Multiple Vitamin (MULTIVITAMIN ADULT) TABS, Take 1 tablet by mouth daily., Disp: , Rfl:    OTEZLA 30 MG TABS, Take 1 tablet by mouth 2 (two) times daily. (Patient not taking: Reported on 06/18/2021), Disp: , Rfl:    Probiotic Product (PROBIOTIC PO), Take 2 tablets by mouth daily before breakfast., Disp: , Rfl:     ALLERGIES   Patient has no known allergies.     Review of Systems: Gen:  Denies  fever, sweats, chills weight loss  HEENT: Denies blurred vision, double vision, ear pain, eye pain, hearing loss, nose bleeds, sore throat Cardiac:  No dizziness, chest pain or heaviness, chest tightness,edema, No JVD Resp:   No cough, -sputum production, -shortness of breath,-wheezing, -hemoptysis,  Other:  All other systems negative   Physical Examination:   General Appearance: No distress  EYES PERRLA, EOM intact.   NECK Supple, No JVD Pulmonary: normal breath sounds, No wheezing.  CardiovascularNormal S1,S2.  No m/r/g.   Abdomen: Benign, Soft, non-tender. Neurology UE/LE 5/5 strength, no focal deficits Ext pulses intact, cap refill intact ALL OTHER ROS ARE NEGATIVE  CBC    Component Value Date/Time   WBC 8.7 01/21/2023 0803   RBC 5.13 01/21/2023 0803   HGB 14.4 01/21/2023 0803   HCT 43.7 01/21/2023 0803   PLT 271.0  01/21/2023 0803   MCV 85.2 01/21/2023 0803   MCH 28.3 06/05/2021 2054   MCHC 33.0 01/21/2023 0803   RDW 12.9 01/21/2023 0803   LYMPHSABS 2.2 01/21/2023 0803   MONOABS 0.7 01/21/2023 0803   EOSABS 1.2 (H) 01/21/2023 0803   BASOSABS 0.1 01/21/2023 0803      Latest Ref Rng & Units 10/18/2022    9:57 AM 06/07/2021   12:02 PM 06/05/2021    8:54 PM  BMP  Glucose 70 - 99 mg/dL 92  91  97   BUN 6 - 23 mg/dL 14  8  8    Creatinine 0.40 - 1.50 mg/dL 1.61  0.96  0.45   Sodium 135 - 145 mEq/L 137  135  134    Potassium 3.5 - 5.1 mEq/L 4.6  4.3  4.2   Chloride 96 - 112 mEq/L 100  99  100   CO2 19 - 32 mEq/L 28  31  23    Calcium 8.4 - 10.5 mg/dL 9.8  9.4  9.1     ASSESSMENT/PLAN   33 year old pleasant white male seen today for follow-up assessment for severe sleep apnea previous AHI was 92   Assessment of sleep apnea Continue current prescription Auto CPAP 5-12 Patient use and benefits from therapy Patient Instructions  Continue to use CPAP every night, minimum of 4-6 hours a night.  Change equipment every 30 days or as directed by DME.  Wash your tubing with warm soap and water daily, hang to dry. Wash humidifier portion weekly. Use bottled, distilled water and change daily  Be aware of reduced alertness and do not drive or operate heavy machinery if experiencing this or drowsiness.  Exercise encouraged, as tolerated. Encouraged proper weight management.  Important to get eight or more hours of sleep  Limiting the use of the computer and television before bedtime.  Decrease naps during the day, so night time sleep will become enhanced.  Limit caffeine, and sleep deprivation.  HTN, stroke, uncontrolled diabetes and heart failure are potential risk factors.  Risk of untreated sleep apnea including cardiac arrhthymias, stroke, DM, pulm HTN.  Referral to DME company [employer] for new machine mask and supplies  Obesity -recommend significant weight loss -recommend changing diet  Deconditioned state -Recommend increased daily activity and exercise    MEDICATION ADJUSTMENTS/LABS AND TESTS ORDERED: Continue auto CPAP as prescribed will change to AUTO CPAP 5-12 cmH20 Referral to new DME company for mask, new machine and supplies Avoid Allergens and Irritants Avoid secondhand smoke Avoid SICK contacts Recommend  Masking  when appropriate Recommend Keep up-to-date with vaccinations Recommend weight loss   CURRENT MEDICATIONS REVIEWED AT LENGTH WITH PATIENT TODAY   Patient   satisfied with Plan of action and management. All questions answered   Follow up 6 months   I spent a total of 41 minutes reviewing chart data, face-to-face evaluation with the patient, counseling and coordination of care as detailed above.      Lucie Leather, M.D.  [facility]  Medical Director [facility] Medical Director [facility] Cardio-Pulmonary Department

## 2023-03-24 ENCOUNTER — Other Ambulatory Visit: Payer: Self-pay | Admitting: Family Medicine

## 2023-03-24 DIAGNOSIS — E039 Hypothyroidism, unspecified: Secondary | ICD-10-CM

## 2023-04-24 ENCOUNTER — Encounter: Payer: Self-pay | Admitting: Internal Medicine

## 2023-04-24 ENCOUNTER — Ambulatory Visit: Payer: Managed Care, Other (non HMO) | Attending: Internal Medicine | Admitting: Internal Medicine

## 2023-04-24 ENCOUNTER — Ambulatory Visit

## 2023-04-24 VITALS — BP 146/90 | HR 69 | Ht 69.0 in | Wt 303.5 lb

## 2023-04-24 DIAGNOSIS — R002 Palpitations: Secondary | ICD-10-CM

## 2023-04-24 DIAGNOSIS — R079 Chest pain, unspecified: Secondary | ICD-10-CM | POA: Diagnosis not present

## 2023-04-24 DIAGNOSIS — I10 Essential (primary) hypertension: Secondary | ICD-10-CM | POA: Diagnosis not present

## 2023-04-24 NOTE — Patient Instructions (Signed)
 Medication Instructions:   Your Physician recommend you continue on your current medication as directed.     *If you need a refill on your cardiac medications before your next appointment, please call your pharmacy*  Lab Work:  No labs ordered today.  Testing/Procedures:  Your physician has recommended that you wear a Zio monitor for 14 days.   This monitor is a medical device that records the heart's electrical activity. Doctors most often use these monitors to diagnose arrhythmias. Arrhythmias are problems with the speed or rhythm of the heartbeat. The monitor is a small device applied to your chest. You can wear one while you do your normal daily activities. While wearing this monitor if you have any symptoms to push the button and record what you felt. Once you have worn this monitor for the period of time provider prescribed (Usually 14 days), you will return the monitor device in the postage paid box. Once it is returned they will download the data collected and provide [place] with a report which the provider will then review and we will call you with those results. Important tips:  Avoid showering during the first 24 hours of wearing the monitor. Avoid excessive sweating to help maximize wear time. Do not submerge the device, no hot tubs, and no swimming pools. Keep any lotions or oils away from the patch. After 24 hours you may shower with the patch on. Take brief showers with your back facing the shower head.  Do not remove patch once it has been placed because that will interrupt data and decrease adhesive wear time. Push the button when you have any symptoms and write down what you were feeling. Once you have completed wearing your monitor, remove and place into box which has postage paid and place in your outgoing mailbox.  If for some reason you have misplaced your box then call our office and we can provide another box and/or mail it off for you.   Follow-Up: At [facility], you and your health needs are our priority.  As part of our continuing mission to provide you with exceptional heart care, our providers are all part of one team.  This team includes your primary Cardiologist (physician) and Advanced Practice Providers or APPs (Physician Assistants and Nurse Practitioners) who all work together to provide you with the care you need, when you need it.  Your next appointment:   2 month(s)  Provider:   You may see Yvonne Kendall, MD or one of the following Advanced Practice Providers on your designated Care Team:   Nicolasa Ducking, NP Ames Dura, PA-C Eula Listen, PA-C Cadence [place], PA-C Charlsie Quest, NP Carlos Levering, NP    We recommend signing up for the patient portal called "MyChart".  Sign up information is provided on this After Visit Summary.  MyChart is used to connect with patients for Virtual Visits (Telemedicine).  Patients are able to view lab/test results, encounter notes, upcoming appointments, etc.  Non-urgent messages can be sent to your provider as well.   To learn more about what you can do with MyChart, go to [email or web address].

## 2023-04-24 NOTE — Progress Notes (Unsigned)
 Cardiology Office Note:  .   Date:  04/25/2023  ID:  Micheal [name], DOB 09/10/1990, MRN [id number] PCP: Eustaquio Boyden, MD   HeartCare Providers Cardiologist:  Yvonne Kendall, MD     History of Present Illness: .   Micheal [name] is a 33 y.o. male with history of palpitations, hypertension, hyperlipidemia, obstructive sleep apnea, psoriasis, childhood asthma, allergies, and eosinophilia, who presents for follow-up of palpitations and hypertension.  I last saw him in 05/2021 for follow-up after ED visit for tachycardia and palpitations.  It was felt that a recent viral illness may have precipitated these symptoms.  At our visit, he was back to baseline.  We did not make any medication changes or pursue additional testing.  Today, Micheal [name] reports that he has been having random episodes of chest pain.  He describes it as a "poking" sensation that lasts for 1 to 10 seconds.  It happens randomly and is typically quite dull.  It is happening a few times a week and and make him somewhat anxious.  He has some associated palpitations.  He has been exercising regularly and does not have any discomfort with exercise.  However, he notes some chest wall soreness after doing upper body exercises.  He has not had any shortness of breath, lightheadedness, or edema.  He is not monitoring his blood pressure at home.  ROS: See HPI  Studies Reviewed: [name]   EKG Interpretation Date/Time:  Thursday April 24 2023 08:17:50 EDT Ventricular Rate:  69 PR Interval:  150 QRS Duration:  88 QT Interval:  374 QTC Calculation: 400 R Axis:   25  Text Interpretation: Normal sinus rhythm Normal ECG When compared with ECG of 08-Jun-2021 HEART RATE has increased Confirmed by Eartha Vonbehren ([id number]) on 04/24/2023 8:30:04 AM    TTE (08/17/2019): Normal LV size and wall thickness.  LVEF 60-65% with normal wall motion and diastolic parameters.  Normal RV size and function.  Normal biatrial size.  No pericardial  effusion.  No significant valvular abnormality.  Normal CVP.  Risk Assessment/Calculations:     HYPERTENSION CONTROL Vitals:   04/24/23 0812 04/24/23 0819 04/24/23 0839  BP: (!) 140/90 (!) 140/80 (!) 146/90    The patient's blood pressure is elevated above target today.  In order to address the patient's elevated BP: Blood pressure will be monitored at home to determine if medication changes need to be made.          Physical Exam:   VS:  BP (!) 146/90 (BP Location: Left Arm, Cuff Size: Large)   Pulse 69   Ht 5\' 9"  (1.753 m)   Wt (!) 303 lb 8 oz (137.7 kg)   SpO2 98%   BMI 44.82 kg/m    Wt Readings from Last 3 Encounters:  04/24/23 (!) 303 lb 8 oz (137.7 kg)  02/13/23 (!) 306 lb 6.4 oz (139 kg)  10/18/22 299 lb 6 oz (135.8 kg)    General:  NAD. Neck: No JVD or HJR. Lungs: Clear to auscultation bilaterally without wheezes or crackles. Heart: Regular rate and rhythm without murmurs, rubs, or gallops. Abdomen: Soft, nontender, nondistended. Extremities: No lower extremity edema.  ASSESSMENT AND PLAN: .    Chest pain and palpitations: Chest pain does not sound cardiac in nature, given its brief duration and fleeting nature.  Query if this could represent transient arrhythmia.  We have discussed further evaluation including exercise tolerance testing and ambulatory cardiac monitor.  Micheal [name] wishes to pursue cardiac monitoring  but forego stress testing at this time which I think is reasonable given his ability to exercise without any symptoms.  Continue metoprolol tartrate 50 mg twice daily.  Hypertension: Blood pressure mildly elevated today.  We have agreed to continue metoprolol to tartrate 50 mg daily and work on lifestyle modifications.    Dispo: Return to clinic in 2 months.  Signed, Yvonne Kendall, MD

## 2023-04-25 ENCOUNTER — Encounter: Payer: Self-pay | Admitting: Internal Medicine

## 2023-04-25 DIAGNOSIS — R079 Chest pain, unspecified: Secondary | ICD-10-CM | POA: Insufficient documentation

## 2023-05-20 ENCOUNTER — Encounter: Payer: Self-pay | Admitting: Internal Medicine

## 2023-05-20 DIAGNOSIS — R002 Palpitations: Secondary | ICD-10-CM

## 2023-05-20 DIAGNOSIS — R079 Chest pain, unspecified: Secondary | ICD-10-CM

## 2023-06-14 ENCOUNTER — Other Ambulatory Visit: Payer: Self-pay | Admitting: Family Medicine

## 2023-06-14 DIAGNOSIS — J3089 Other allergic rhinitis: Secondary | ICD-10-CM

## 2023-06-25 ENCOUNTER — Encounter: Payer: Self-pay | Admitting: Nurse Practitioner

## 2023-06-25 ENCOUNTER — Ambulatory Visit: Attending: Nurse Practitioner | Admitting: Nurse Practitioner

## 2023-06-25 VITALS — BP 124/70 | HR 63 | Ht 68.5 in | Wt 305.0 lb

## 2023-06-25 DIAGNOSIS — R002 Palpitations: Secondary | ICD-10-CM | POA: Diagnosis not present

## 2023-06-25 DIAGNOSIS — I10 Essential (primary) hypertension: Secondary | ICD-10-CM | POA: Diagnosis not present

## 2023-06-25 DIAGNOSIS — R079 Chest pain, unspecified: Secondary | ICD-10-CM

## 2023-06-25 NOTE — Patient Instructions (Addendum)
              Medication Instructions:  Your physician recommends that you continue on your current medications as directed. Please refer to the Current Medication list given to you today.   *If you need a refill on your cardiac medications before your next appointment, please call your pharmacy*  Lab Work: None ordered at this time   Follow-Up: At [facility], you and your health needs are our priority.  As part of our continuing mission to provide you with exceptional heart care, our providers are all part of one team.  This team includes your primary Cardiologist (physician) and Advanced Practice Providers or APPs (Physician Assistants and Nurse Practitioners) who all work together to provide you with the care you need, when you need it.  Your next appointment:   6 month(s)  Provider:   You may see Sammy Crisp, MD or one of the following Advanced Practice Providers on your designated Care Team:   Laneta Pintos, NP Gildardo Labrador, PA-C Varney Gentleman, PA-C Cadence Holley, PA-C Ronald Cockayne, NP Morey Ar, NP

## 2023-06-25 NOTE — Progress Notes (Signed)
 Office Visit    Patient Name: Micheal [name] Date of Encounter: 06/25/2023  Primary Care Provider:  Claire Crick, MD Primary Cardiologist:  Sammy Crisp, MD  Chief Complaint    33 y.o. male with a history of palpitations, hypertension, hyperlipidemia, obstructive sleep apnea, psoriasis, childhood asthma, allergies, and eosinophilia, who presents for follow-up related to hypertension and palpitations.  Past Medical History   Subjective   Past Medical History:  Diagnosis Date   Childhood asthma    Eosinophilia    Mixed hyperlipidemia    Obstructive sleep apnea    Palpitations    a. 08/2019 Echo: EF 60-65%, no rwma.  No significant valvular disease; b. 04/2023 Zio: Predominantly sinus rhythm, average 71 (43-129).  Rare PACs/PVCs.  No sustained arrhythmias or prolonged pauses.  Triggered events = sinus rhythm.   Perennial allergic rhinitis    Primary hypertension    Psoriasis    Past Surgical History:  Procedure Laterality Date   WISDOM TOOTH EXTRACTION      Allergies  No Known Allergies     History of Present Illness      33 y.o. y/o male with a history of palpitations, hypertension, hyperlipidemia, obstructive sleep apnea, psoriasis, childhood asthma, allergies, and eosinophilia.  He initially establish care in 2021 in the setting of intermittent heart rate elevations and palpitations that really improved with the addition of metoprolol .  Echocardiogram at that time showed an EF of 60 to 65% without regional wall motion abnormalities or significant valvular disease.    In early 2023, he was seen in the emergency department with tachycardia and palpitations, which were felt to be precipitated by a prior viral illness.  He did not require any medication changes or additional testing at that time.   [name] was last seen in cardiology clinic in April 2025, at which time he reported intermittent episodes of focal and fleeting chest discomfort described as a  poking sensation.  This was also associated with palpitations.  He was not having any symptoms during exercise.  A ZIO monitor was placed and showed predominantly sinus rhythm with an average rate of 71 bpm, rare PACs and PVCs, and no significant arrhythmias or pauses.  Triggered events were associated with sinus rhythm.    Since wearing the ZIO monitor, [name] has done well.  He has not noticed palpitations or the previously described poking sensation.  He and his wife have been exercising twice a week at [employer].  He does about 10 minutes on the treadmill before doing about 35 minutes of weight training.  We discussed strategies for increasing his aerobic activity today.  He would like to lose about 25 pounds over the next 6 months.  He denies chest pain, dyspnea, palpitations, PND, orthopnea, dizziness, syncope, edema, or early satiety. Objective   Home Medications    Current Outpatient Medications  Medication Sig Dispense Refill   augmented betamethasone dipropionate (DIPROLENE-AF) 0.05 % cream APPLY TO AFFECTED AREA ON ELBOWS TWICE A DAY UNTIL CLEAR, THEN USE AS NEEDED     calcipotriene (DOVONOX) 0.005 % cream APPLY ON THE SKIN AS DIRECTED TWICE DAILY TO FACE AND FOLD OF SKIN UNTIL CLEAR THEN AS NEEDED     famotidine  (PEPCID ) 20 MG tablet Take 1 tablet (20 mg total) by mouth at bedtime. 90 tablet 4   fluticasone  (FLONASE ) 50 MCG/ACT nasal spray Place 2 sprays into both nostrils daily. 48 mL 4   ketoconazole (NIZORAL) 2 % shampoo APPLY TO AFFECTED AREA  EVERY DAY     levocetirizine (XYZAL ) 5 MG tablet TAKE 1 TABLET BY MOUTH EVERY DAY IN THE EVENING 30 tablet 4   levothyroxine  (SYNTHROID ) 50 MCG tablet TAKE 1 TABLET BY MOUTH EVERY DAY 90 tablet 2   metoprolol  tartrate (LOPRESSOR ) 50 MG tablet Take 1 tablet (50 mg total) by mouth 2 (two) times daily. 180 tablet 4   Multiple Vitamin (MULTIVITAMIN ADULT) TABS Take 1 tablet by mouth daily.     OTEZLA 30 MG TABS Take 1 tablet by mouth 2  (two) times daily.     Probiotic Product (PROBIOTIC PO) Take 2 tablets by mouth daily before breakfast.     No current facility-administered medications for this visit.     Physical Exam    VS:  BP 124/70 (BP Location: Left Arm, Patient Position: Sitting, Cuff Size: Large)   Pulse 63   Ht 5' 8.5 (1.74 m)   Wt (!) 305 lb (138.3 kg)   SpO2 98%   BMI 45.70 kg/m  , BMI Body mass index is 45.7 kg/m.       GEN: Well nourished, well developed, in no acute distress. HEENT: normal. Neck: Supple, no JVD, carotid bruits, or masses. Cardiac: RRR, no murmurs, rubs, or gallops. No clubbing, cyanosis, edema.  Radials 2+/PT 2+ and equal bilaterally.  Respiratory:  Respirations regular and unlabored, clear to auscultation bilaterally. GI: Soft, nontender, nondistended, BS + x 4. MS: no deformity or atrophy. Skin: warm and dry, no rash. Neuro:  Strength and sensation are intact. Psych: Normal affect.  Accessory Clinical Findings    Lab Results  Component Value Date   WBC 8.7 01/21/2023   HGB 14.4 01/21/2023   HCT 43.7 01/21/2023   MCV 85.2 01/21/2023   PLT 271.0 01/21/2023   Lab Results  Component Value Date   CREATININE 0.92 10/18/2022   BUN 14 10/18/2022   NA 137 10/18/2022   K 4.6 10/18/2022   CL 100 10/18/2022   CO2 28 10/18/2022   Lab Results  Component Value Date   ALT 62 (H) 01/21/2023   AST 27 01/21/2023   ALKPHOS 87 01/21/2023   BILITOT 0.6 01/21/2023   Lab Results  Component Value Date   CHOL 184 10/18/2022   HDL 37.50 (L) 10/18/2022   LDLCALC 117 (H) 10/18/2022   LDLDIRECT 151.0 11/09/2015   TRIG 144.0 10/18/2022   CHOLHDL 5 10/18/2022    Lab Results  Component Value Date   TSH 3.34 01/21/2023       Assessment & Plan    1.  Chest pain: Patient previously reported an intermittent, focal, poking sensation, associated with palpitations.  Recent event monitoring and prior ECG were unremarkable.  He exercises twice a week without symptoms or limitations.   Since wearing an event monitor, he has not had any palpitations or poking sensation.  We went over his monitor results today.  Reassurance provided.  He was concerned about a family history of premature heart disease.  His father had a bicuspid aortic valve and required aortic valve replacement in his late 34s.  He did not have coronary artery disease or require bypass.  Here also, reassurance was provided.  I did suggest that if he is concerned about his cardiac risk going forward, that a calcium score might be appropriate for him.  2.  Palpitations: See #1.  Quiescent since recent monitoring.  No sustained arrhythmias or pauses on monitoring.  Continue beta-blocker therapy.  3.  Primary hypertension: Blood pressure stable at 124/70  on beta-blocker therapy.  Patient will continue to work on lifestyle modifications.  4.  Morbid obesity: Patient is 305 pounds with a BMI of 45.70.  He and his wife are going to the gym twice a week.  We discussed strategies for increasing his aerobic activity while at the gym.  He would like to lose 25 pounds over the next 6 months, which is a reasonable goal.    5.  Disposition: Follow-up in 6 months or sooner if necessary.  Laneta Pintos, NP 06/25/2023, 8:19 AM

## 2023-10-09 ENCOUNTER — Other Ambulatory Visit: Payer: Self-pay | Admitting: Family Medicine

## 2023-10-09 DIAGNOSIS — J3089 Other allergic rhinitis: Secondary | ICD-10-CM

## 2023-10-12 ENCOUNTER — Other Ambulatory Visit: Payer: Self-pay | Admitting: Family Medicine

## 2023-10-12 DIAGNOSIS — D721 Eosinophilia, unspecified: Secondary | ICD-10-CM

## 2023-10-12 DIAGNOSIS — E039 Hypothyroidism, unspecified: Secondary | ICD-10-CM

## 2023-10-12 DIAGNOSIS — E782 Mixed hyperlipidemia: Secondary | ICD-10-CM

## 2023-10-13 ENCOUNTER — Other Ambulatory Visit: Payer: Managed Care, Other (non HMO)

## 2023-10-20 ENCOUNTER — Encounter: Payer: Managed Care, Other (non HMO) | Admitting: Family Medicine

## 2023-11-10 ENCOUNTER — Other Ambulatory Visit: Payer: Self-pay | Admitting: Family Medicine

## 2023-11-10 DIAGNOSIS — E039 Hypothyroidism, unspecified: Secondary | ICD-10-CM

## 2023-11-11 NOTE — Telephone Encounter (Signed)
 Name of Medication: Levothyroxine   Name of Pharmacy: [facility] Last [place] or Written Date and Quantity:  Last Office Visit and Type:  Next Office Visit and Type:  Last Controlled Substance Agreement Date:  Last UDS:

## 2023-11-13 ENCOUNTER — Other Ambulatory Visit: Payer: Self-pay | Admitting: Family Medicine

## 2023-11-13 DIAGNOSIS — E039 Hypothyroidism, unspecified: Secondary | ICD-10-CM

## 2023-11-13 NOTE — Telephone Encounter (Unsigned)
 Copied from CRM #[id number]. Topic: Clinical - Medication Refill >> Nov 13, 2023 11:04 AM Micheal [name] wrote: Medication: levothyroxine  (SYNTHROID ) 50 MCG tablet  Has the patient contacted their pharmacy? Yes, pharmacy is requesting pt to contact clinic. (Agent: If no, request that the patient contact the pharmacy for the refill. If patient does not wish to contact the pharmacy document the reason why and proceed with request.) (Agent: If yes, when and what did the pharmacy advise?)  This is the patient'[name] preferred pharmacy:  [id number] [place], [place] - [address] Micheal [name] [place]heal [name] [place] [address] Phone: [phone] Fax: [phone]  Is this the correct pharmacy for this prescription? Yes If no, delete pharmacy and type the correct one.   Has the prescription been filled recently? No  Is the patient out of the medication? No, pt has 3 more days left of medication.  Has the patient been seen for an appointment in the last year OR does the patient have an upcoming appointment? Yes  Can we respond through MyChart? Yes  Agent: Please be advised that Rx refills may take up to 3 business days. We ask that you follow-up with your pharmacy.

## 2023-11-14 ENCOUNTER — Other Ambulatory Visit: Payer: Self-pay | Admitting: Family Medicine

## 2023-11-14 DIAGNOSIS — I10 Essential (primary) hypertension: Secondary | ICD-10-CM

## 2023-11-14 DIAGNOSIS — K219 Gastro-esophageal reflux disease without esophagitis: Secondary | ICD-10-CM

## 2023-11-14 MED ORDER — LEVOTHYROXINE SODIUM 50 MCG PO TABS: 50.0000 ug | ORAL_TABLET | Freq: Every day | ORAL | 0 refills | Status: DC

## 2023-11-14 NOTE — Telephone Encounter (Signed)
 ERx

## 2023-11-14 NOTE — Addendum Note (Signed)
 Addended by: RILLA BALLER on: 11/14/2023 07:30 AM   Modules accepted: Orders

## 2023-11-24 ENCOUNTER — Ambulatory Visit: Payer: Self-pay | Admitting: Family Medicine

## 2023-11-24 ENCOUNTER — Other Ambulatory Visit

## 2023-11-24 DIAGNOSIS — E782 Mixed hyperlipidemia: Secondary | ICD-10-CM | POA: Diagnosis not present

## 2023-11-24 DIAGNOSIS — D721 Eosinophilia, unspecified: Secondary | ICD-10-CM

## 2023-11-24 DIAGNOSIS — E039 Hypothyroidism, unspecified: Secondary | ICD-10-CM | POA: Diagnosis not present

## 2023-11-24 LAB — CBC WITH DIFFERENTIAL/PLATELET
Basophils Absolute: 0.1 K/uL (ref 0.0–0.1)
Basophils Relative: 0.7 % (ref 0.0–3.0)
Eosinophils Absolute: 2.2 K/uL — ABNORMAL HIGH (ref 0.0–0.7)
Eosinophils Relative: 26.7 % — ABNORMAL HIGH (ref 0.0–5.0)
HCT: 41.2 % (ref 39.0–52.0)
Hemoglobin: 14 g/dL (ref 13.0–17.0)
Lymphocytes Relative: 21.2 % (ref 12.0–46.0)
Lymphs Abs: 1.7 K/uL (ref 0.7–4.0)
MCHC: 33.9 g/dL (ref 30.0–36.0)
MCV: 83.3 fl (ref 78.0–100.0)
Monocytes Absolute: 0.5 K/uL (ref 0.1–1.0)
Monocytes Relative: 6.6 % (ref 3.0–12.0)
Neutro Abs: 3.7 K/uL (ref 1.4–7.7)
Neutrophils Relative %: 44.8 % (ref 43.0–77.0)
Platelets: 239 K/uL (ref 150.0–400.0)
RBC: 4.95 Mil/uL (ref 4.22–5.81)
RDW: 13.2 % (ref 11.5–15.5)
WBC: 8.2 K/uL (ref 4.0–10.5)

## 2023-11-24 LAB — COMPREHENSIVE METABOLIC PANEL WITH GFR
ALT: 45 U/L (ref 0–53)
AST: 25 U/L (ref 0–37)
Albumin: 4.5 g/dL (ref 3.5–5.2)
Alkaline Phosphatase: 73 U/L (ref 39–117)
BUN: 13 mg/dL (ref 6–23)
CO2: 29 meq/L (ref 19–32)
Calcium: 9.6 mg/dL (ref 8.4–10.5)
Chloride: 100 meq/L (ref 96–112)
Creatinine, Ser: 0.91 mg/dL (ref 0.40–1.50)
GFR: 109.29 mL/min (ref >60.00)
Glucose, Bld: 84 mg/dL (ref 70–99)
Potassium: 4.6 meq/L (ref 3.5–5.1)
Sodium: 137 meq/L (ref 135–145)
Total Bilirubin: 0.6 mg/dL (ref 0.2–1.2)
Total Protein: 7.2 g/dL (ref 6.0–8.3)

## 2023-11-24 LAB — LIPID PANEL
Cholesterol: 187 mg/dL (ref 0–200)
HDL: 35.6 mg/dL — ABNORMAL LOW (ref >39.00)
LDL Cholesterol: 126 mg/dL — ABNORMAL HIGH (ref 0–99)
NonHDL: 151.46
Total CHOL/HDL Ratio: 5
Triglycerides: 127 mg/dL (ref 0.0–149.0)
VLDL: 25.4 mg/dL (ref 0.0–40.0)

## 2023-11-24 LAB — TSH: TSH: 2.56 u[IU]/mL (ref 0.35–5.50)

## 2023-12-02 ENCOUNTER — Ambulatory Visit (INDEPENDENT_AMBULATORY_CARE_PROVIDER_SITE_OTHER): Admitting: Family Medicine

## 2023-12-02 ENCOUNTER — Encounter: Payer: Self-pay | Admitting: Family Medicine

## 2023-12-02 VITALS — BP 136/82 | HR 64 | Temp 98.6°F | Ht 68.5 in | Wt 303.4 lb

## 2023-12-02 DIAGNOSIS — D721 Eosinophilia, unspecified: Secondary | ICD-10-CM

## 2023-12-02 DIAGNOSIS — E039 Hypothyroidism, unspecified: Secondary | ICD-10-CM | POA: Diagnosis not present

## 2023-12-02 DIAGNOSIS — G4733 Obstructive sleep apnea (adult) (pediatric): Secondary | ICD-10-CM

## 2023-12-02 DIAGNOSIS — J3089 Other allergic rhinitis: Secondary | ICD-10-CM

## 2023-12-02 DIAGNOSIS — K219 Gastro-esophageal reflux disease without esophagitis: Secondary | ICD-10-CM

## 2023-12-02 DIAGNOSIS — E782 Mixed hyperlipidemia: Secondary | ICD-10-CM

## 2023-12-02 DIAGNOSIS — Z0000 Encounter for general adult medical examination without abnormal findings: Secondary | ICD-10-CM | POA: Diagnosis not present

## 2023-12-02 DIAGNOSIS — I10 Essential (primary) hypertension: Secondary | ICD-10-CM

## 2023-12-02 DIAGNOSIS — L409 Psoriasis, unspecified: Secondary | ICD-10-CM

## 2023-12-02 MED ORDER — LEVOTHYROXINE SODIUM 50 MCG PO TABS: 50.0000 ug | ORAL_TABLET | Freq: Every day | ORAL | 3 refills | Status: AC

## 2023-12-02 MED ORDER — METOPROLOL TARTRATE 50 MG PO TABS: 50.0000 mg | ORAL_TABLET | Freq: Two times a day (BID) | ORAL | 3 refills | Status: AC

## 2023-12-02 MED ORDER — LEVOCETIRIZINE DIHYDROCHLORIDE 5 MG PO TABS: 5.0000 mg | ORAL_TABLET | Freq: Every evening | ORAL | 3 refills | Status: AC

## 2023-12-02 MED ORDER — FAMOTIDINE 20 MG PO TABS: 20.0000 mg | ORAL_TABLET | Freq: Every day | ORAL | 3 refills | Status: AC

## 2023-12-02 NOTE — Assessment & Plan Note (Signed)
Continue xyzal, flonase

## 2023-12-02 NOTE — Progress Notes (Signed)
 Ph: [phone] Fax: [phone]   Patient ID: Micheal [name], male    DOB: 1990/06/30, 33 y.o.   MRN: [id number]  This visit was conducted in person.  BP 136/82   Pulse 64   Temp 98.6 F (37 C) (Oral)   Ht 5' 8.5 (1.74 m)   Wt (!) 303 lb 6 oz (137.6 kg)   SpO2 98%   BMI 45.46 kg/m    CC: CPE Subjective:   HPI: Micheal [name] is a 33 y.o. male presenting on 12/02/2023 for Annual Exam (Pt here for CPE)   Married 2023. [place] [facility] [place] - really enjoying.   Saw cardiology Dr. Mady for possible viral induced SVT - continues metoprolol  50 mg twice daily with additional half to 1 tablet as needed for recurrent palpitations.   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    Eosinophilia was seen by Dr Jacobo - reassuring evaluation rec yearly CBC. Attributes recent elevation to psoriasis flare. Overall feels well.   Psoriasis- sees derm on Diprolene AF and calcipotriene and Otezla ([facility]). Upcoming appt next month.    Peak weight 318 lbs. Less active at gym - planning to get treadmill for Christmas.   Preventative: No fmhx prostate or colon cancer  Flu shot yearly - declined COVID vaccine - declines  Tdap 10/2015  Seat belt use discussed.  Sunscreen use discussed. No changing moles on skin.  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 Interim medical history since our last visit reviewed. Allergies and medications reviewed and updated. Outpatient Medications Prior to Visit  Medication Sig Dispense Refill   augmented betamethasone  dipropionate (DIPROLENE-AF) 0.05 % cream APPLY TO AFFECTED AREA ON ELBOWS TWICE A DAY UNTIL CLEAR, THEN USE AS NEEDED     calcipotriene (DOVONOX) 0.005 % cream APPLY ON THE SKIN AS DIRECTED TWICE DAILY TO FACE AND FOLD OF SKIN UNTIL CLEAR THEN AS NEEDED     fluticasone  (FLONASE ) 50 MCG/ACT nasal spray Place 2 sprays into both nostrils daily. 48 mL 4   ketoconazole (NIZORAL) 2 % shampoo APPLY TO AFFECTED AREA EVERY DAY     Multiple Vitamin (MULTIVITAMIN ADULT) TABS Take 1 tablet by mouth daily.     OTEZLA 30 MG TABS Take 1 tablet by mouth 2 (two) times daily.     Probiotic Product (PROBIOTIC PO) Take 2 tablets by mouth daily before breakfast.     famotidine  (PEPCID ) 20 MG tablet TAKE 1 TABLET BY MOUTH EVERYDAY AT BEDTIME 90 tablet 0   levocetirizine (XYZAL ) 5 MG tablet TAKE 1 TABLET BY MOUTH EVERY DAY IN THE EVENING 90 tablet 0   levothyroxine  (SYNTHROID ) 50 MCG tablet Take 1 tablet (50 mcg total) by mouth daily. 90 tablet 0   metoprolol  tartrate (LOPRESSOR ) 50 MG tablet TAKE 1 TABLET BY MOUTH TWICE A DAY 180 tablet 0   No facility-administered medications prior to visit.     Per HPI unless specifically indicated in ROS section below Review of Systems  Constitutional:  Negative for activity change, appetite change,  fever and unexpected weight change.  HENT:  Negative for congestion and hearing loss.   Eyes:  Negative for visual disturbance.  Respiratory:  Negative for cough, chest tightness, shortness of breath and wheezing.   Cardiovascular:  Negative for chest pain, palpitations and leg swelling.  Gastrointestinal:  Negative for abdominal distention, abdominal pain, blood in stool, constipation, diarrhea, nausea and vomiting.  Genitourinary:  Negative for difficulty urinating and hematuria.  Musculoskeletal:  Negative for arthralgias, myalgias and neck pain.  Skin:  Negative for rash.  Neurological:  Negative for dizziness, seizures, syncope and headaches.  Hematological:  Negative for  adenopathy. Does not bruise/bleed easily.  Psychiatric/Behavioral:  Negative for dysphoric mood. The patient is not nervous/anxious.     Objective:  BP 136/82   Pulse 64   Temp 98.6 F (37 C) (Oral)   Ht 5' 8.5 (1.74 m)   Wt (!) 303 lb 6 oz (137.6 kg)   SpO2 98%   BMI 45.46 kg/m   Wt Readings from Last 3 Encounters:  12/02/23 (!) 303 lb 6 oz (137.6 kg)  06/25/23 (!) 305 lb (138.3 kg)  04/24/23 (!) 303 lb 8 oz (137.7 kg)      Physical Exam Vitals and nursing note reviewed.  Constitutional:      General: He is not in acute distress.    Appearance: Normal appearance. He is well-developed. He is not ill-appearing.  HENT:     Head: Normocephalic and atraumatic.     Right Ear: Hearing, tympanic membrane, ear canal and external ear normal.     Left Ear: Hearing, tympanic membrane, ear canal and external ear normal.     Mouth/Throat:     Mouth: Mucous membranes are moist.     Pharynx: Oropharynx is clear. No oropharyngeal exudate or posterior oropharyngeal erythema.  Eyes:     General: No scleral icterus.    Extraocular Movements: Extraocular movements intact.     Conjunctiva/sclera: Conjunctivae normal.     Pupils: Pupils are equal, round, and reactive to light.  Neck:     Thyroid : No thyroid  mass or thyromegaly.  Cardiovascular:     Rate and Rhythm: Normal rate and regular rhythm.     Pulses: Normal pulses.          Radial pulses are 2+ on the right side and 2+ on the left side.     Heart sounds: Normal heart sounds. No murmur heard. Pulmonary:     Effort: Pulmonary effort is normal. No respiratory distress.     Breath sounds: Normal breath sounds. No wheezing, rhonchi or rales.  Abdominal:     General: Bowel sounds are normal. There is no distension.     Palpations: Abdomen is soft. There is no mass.     Tenderness: There is no abdominal tenderness. There is no guarding or rebound.     Hernia: No hernia is present.  Musculoskeletal:        General: Normal range of  motion.     Cervical back: Normal range of motion and neck supple.     Right lower leg: No edema.     Left lower leg: No edema.  Lymphadenopathy:     Cervical: No cervical adenopathy.  Skin:    General: Skin is warm and dry.     Findings: No rash.  Neurological:     General: No focal deficit present.     Mental Status: He is alert and oriented to person, place, and time.  Psychiatric:  Mood and Affect: Mood normal.        Behavior: Behavior normal.        Thought Content: Thought content normal.        Judgment: Judgment normal.       Results for orders placed or performed in visit on 11/24/23  CBC with Differential/Platelet   Collection Time: 11/24/23  8:14 AM  Result Value Ref Range   WBC 8.2 4.0 - 10.5 K/uL   RBC 4.95 4.22 - 5.81 Mil/uL   Hemoglobin 14.0 13.0 - 17.0 g/dL   HCT 58.7 60.9 - 47.9 %   MCV 83.3 78.0 - 100.0 fl   MCHC 33.9 30.0 - 36.0 g/dL   RDW 86.7 88.4 - 84.4 %   Platelets 239.0 150.0 - 400.0 K/uL   Neutrophils Relative % 44.8 43.0 - 77.0 %   Lymphocytes Relative 21.2 12.0 - 46.0 %   Monocytes Relative 6.6 3.0 - 12.0 %   Eosinophils Relative 26.7 Repeated and verified X2. (H) 0.0 - 5.0 %   Basophils Relative 0.7 0.0 - 3.0 %   Neutro Abs 3.7 1.4 - 7.7 K/uL   Lymphs Abs 1.7 0.7 - 4.0 K/uL   Monocytes Absolute 0.5 0.1 - 1.0 K/uL   Eosinophils Absolute 2.2 (H) 0.0 - 0.7 K/uL   Basophils Absolute 0.1 0.0 - 0.1 K/uL  TSH   Collection Time: 11/24/23  8:14 AM  Result Value Ref Range   TSH 2.56 0.35 - 5.50 uIU/mL  Comprehensive metabolic panel with GFR   Collection Time: 11/24/23  8:14 AM  Result Value Ref Range   Sodium 137 135 - 145 mEq/L   Potassium 4.6 3.5 - 5.1 mEq/L   Chloride 100 96 - 112 mEq/L   CO2 29 19 - 32 mEq/L   Glucose, Bld 84 70 - 99 mg/dL   BUN 13 6 - 23 mg/dL   Creatinine, Ser 9.08 0.40 - 1.50 mg/dL   Total Bilirubin 0.6 0.2 - 1.2 mg/dL   Alkaline Phosphatase 73 39 - 117 U/L   AST 25 0 - 37 U/L   ALT 45 0 - 53 U/L   Total  Protein 7.2 6.0 - 8.3 g/dL   Albumin 4.5 3.5 - 5.2 g/dL   GFR 890.70 >39.99 mL/min   Calcium 9.6 8.4 - 10.5 mg/dL  Lipid panel   Collection Time: 11/24/23  8:14 AM  Result Value Ref Range   Cholesterol 187 0 - 200 mg/dL   Triglycerides 872.9 0.0 - 149.0 mg/dL   HDL 64.39 (L) >60.99 mg/dL   VLDL 74.5 0.0 - 59.9 mg/dL   LDL Cholesterol 873 (H) 0 - 99 mg/dL   Total CHOL/HDL Ratio 5    NonHDL 151.46       12/02/2023   12:22 PM 06/18/2021   10:06 AM 04/02/2021    8:26 AM 04/05/2020    9:01 AM 03/12/2019    3:17 PM  Depression screen PHQ 2/9  Decreased Interest 0 0 0 0 0  Down, Depressed, Hopeless 0 0 0 0 0  PHQ - 2 Score 0 0 0 0 0  Altered sleeping 0  0 0 0  Tired, decreased energy 0  1 0 0  Change in appetite 0  0 0 2  Feeling bad or failure about yourself  0  0 0 0  Trouble concentrating 0  0 0 0  Moving slowly or fidgety/restless 0  0 0 0  Suicidal thoughts 0  0 0 0  PHQ-9 Score 0  1  0  2   Difficult doing work/chores Not difficult at all   Not difficult at all      Data saved with a previous flowsheet row definition       12/02/2023   12:23 PM 04/02/2021    8:26 AM 03/12/2019    3:18 PM  GAD 7 : Generalized Anxiety Score  Nervous, Anxious, on Edge 1 0 1  Control/stop worrying 0 0 0  Worry too much - different things 0 1 1  Trouble relaxing 0 0 0  Restless 0 0 0  Easily annoyed or irritable 0 0 0  Afraid - awful might happen 0 1 1  Total GAD 7 Score 1 2 3   Anxiety Difficulty Not difficult at all     Assessment & Plan:   Problem List Items Addressed This Visit     Health maintenance examination - Primary (Chronic)   Preventative protocols reviewed and updated unless pt declined. Discussed healthy diet and lifestyle.       Perennial allergic rhinitis   Continue xyzal , flonase .       Relevant Medications   levocetirizine (XYZAL ) 5 MG tablet   Obesity, morbid, BMI 40.0-49.9 (HCC)   Continue to encourage healthy diet and lifestyle choices to affect sustainable  weight loss.       Psoriasis   Appreciate derm care       OSA on CPAP   Continue nightly CPAP, followed by Dr Isaiah.       Essential hypertension   Chronic, stable only on metoprolol  - continue this.      Relevant Medications   metoprolol  tartrate (LOPRESSOR ) 50 MG tablet   Eosinophilia   Chronic, saw heme, thought related to allergies and psoriasis.  Continue yearly CBC.       GERD (gastroesophageal reflux disease)   Chronic, stable on pepcid  20mg  daily - continue.       Relevant Medications   famotidine  (PEPCID ) 20 MG tablet   Mixed hyperlipidemia   Chronic. Reviewed diet choices to improve cholesterol control.       Relevant Medications   metoprolol  tartrate (LOPRESSOR ) 50 MG tablet   Hypothyroidism   Chronic, continue levothyroxine  50mcg daily.       Relevant Medications   levothyroxine  (SYNTHROID ) 50 MCG tablet   metoprolol  tartrate (LOPRESSOR ) 50 MG tablet     Meds ordered this encounter  Medications   levothyroxine  (SYNTHROID ) 50 MCG tablet    Sig: Take 1 tablet (50 mcg total) by mouth daily.    Dispense:  90 tablet    Refill:  3    E039   metoprolol  tartrate (LOPRESSOR ) 50 MG tablet    Sig: Take 1 tablet (50 mg total) by mouth 2 (two) times daily.    Dispense:  180 tablet    Refill:  3   famotidine  (PEPCID ) 20 MG tablet    Sig: Take 1 tablet (20 mg total) by mouth at bedtime.    Dispense:  90 tablet    Refill:  3   levocetirizine (XYZAL ) 5 MG tablet    Sig: Take 1 tablet (5 mg total) by mouth every evening.    Dispense:  90 tablet    Refill:  3    No orders of the defined types were placed in this encounter.   Patient Instructions  You are doing well today Continue current medicines  Work on incorporating regular exercise into routine  Return as needed or in 1 year for next physical  Follow up plan: Return in about 1 year (around 12/01/2024) for annual exam, prior fasting for blood work.  Anton Blas, MD

## 2023-12-02 NOTE — Assessment & Plan Note (Signed)
 Chronic, stable only on metoprolol  - continue this.

## 2023-12-02 NOTE — Assessment & Plan Note (Signed)
Appreciate derm care.

## 2023-12-02 NOTE — Assessment & Plan Note (Addendum)
 Chronic, stable on pepcid  20mg  daily - continue.

## 2023-12-02 NOTE — Assessment & Plan Note (Signed)
 Preventative protocols reviewed and updated unless pt declined. Discussed healthy diet and lifestyle.

## 2023-12-02 NOTE — Assessment & Plan Note (Signed)
 Continue to encourage healthy diet and lifestyle choices to affect sustainable weight loss.

## 2023-12-02 NOTE — Assessment & Plan Note (Signed)
Chronic, continue levothyroxine 50 mcg daily.

## 2023-12-02 NOTE — Assessment & Plan Note (Signed)
 Chronic, saw heme, thought related to allergies and psoriasis.  Continue yearly CBC.

## 2023-12-02 NOTE — Patient Instructions (Signed)
 You are doing well today Continue current medicines  Work on incorporating regular exercise into routine  Return as needed or in 1 year for next physical

## 2023-12-02 NOTE — Assessment & Plan Note (Signed)
 Continue nightly CPAP, followed by Dr Isaiah.

## 2023-12-02 NOTE — Assessment & Plan Note (Signed)
 Chronic. Reviewed diet choices to improve cholesterol control.

## 2023-12-24 ENCOUNTER — Ambulatory Visit: Admitting: Nurse Practitioner

## 2024-01-02 ENCOUNTER — Ambulatory Visit: Attending: Nurse Practitioner | Admitting: Nurse Practitioner

## 2024-01-02 ENCOUNTER — Encounter: Payer: Self-pay | Admitting: Nurse Practitioner

## 2024-01-02 VITALS — BP 124/90 | HR 65 | Ht 68.5 in | Wt 309.5 lb

## 2024-01-02 DIAGNOSIS — R079 Chest pain, unspecified: Secondary | ICD-10-CM

## 2024-01-02 DIAGNOSIS — I10 Essential (primary) hypertension: Secondary | ICD-10-CM

## 2024-01-02 DIAGNOSIS — R002 Palpitations: Secondary | ICD-10-CM | POA: Diagnosis not present

## 2024-01-02 NOTE — Patient Instructions (Signed)
 Medication Instructions:  Your physician recommends that you continue on your current medications as directed. Please refer to the Current Medication list given to you today.   *If you need a refill on your cardiac medications before your next appointment, please call your pharmacy*  Lab Work: No labs ordered today   Testing/Procedures: No test ordered today   Follow-Up: At [facility], you and your health needs are our priority.  As part of our continuing mission to provide you with exceptional heart care, our providers are all part of one team.  This team includes your primary Cardiologist (physician) and Advanced Practice Providers or APPs (Physician Assistants and Nurse Practitioners) who all work together to provide you with the care you need, when you need it.  Your next appointment:   1 year(s)  Provider:   Sammy Crisp, MD

## 2024-01-02 NOTE — Progress Notes (Signed)
 "    Office Visit    Patient Name: Micheal [name] Date of Encounter: 01/02/2024  Primary Care Provider:  Rilla Baller, MD Primary Cardiologist:  Lonni Hanson, MD    Chief Complaint    33 y.o. male with a history of palpitations, hypertension, hyperlipidemia, obstructive sleep apnea, psoriasis, childhood asthma, allergies, and eosinophilia, who presents for follow-up related to hypertension and palpitations.   Past Medical History   Subjective   Past Medical History:  Diagnosis Date   Childhood asthma    Eosinophilia    Mixed hyperlipidemia    Obstructive sleep apnea    Palpitations    a. 08/2019 Echo: EF 60-65%, no rwma.  No significant valvular disease; b. 04/2023 Zio: Predominantly sinus rhythm, average 71 (43-129).  Rare PACs/PVCs.  No sustained arrhythmias or prolonged pauses.  Triggered events = sinus rhythm.   Perennial allergic rhinitis    Primary hypertension    Psoriasis    Past Surgical History:  Procedure Laterality Date   WISDOM TOOTH EXTRACTION      Allergies  Allergies[1]     History of Present Illness      33 y.o. y/o male with a history of palpitations, hypertension, hyperlipidemia, obstructive sleep apnea, psoriasis, childhood asthma, allergies, and eosinophilia.  He initially established care in 2021 in the setting of intermittent heart rate elevations and palpitations that really improved with the addition of metoprolol .  Echocardiogram at that time showed an EF of 60 to 65% without regional wall motion abnormalities or significant valvular disease.    In early 2023, he was seen in the emergency department with tachycardia and palpitations, which were felt to be precipitated by a prior viral illness.  He did not require any medication changes or additional testing at that time.  In the setting of recurrent palpitations and reported frequent fleeting chest discomfort described as a poking sensation, he wore a ZIO monitor in April 2025 which  showed predominantly sinus rhythm with an average rate of 71, rare PACs and PVCs, and no significant arrhythmias or pauses.  Triggered events were associated with sinus rhythm.     [name] was last seen in cardiology clinic in June 2025 at which time he was doing well without recurrence of poking sensation or palpitations.  He now notes occasional poking sensation but overall this is much less bothersome than earlier this year.  He is not exercising as often as he would like after having a very stressful summer moving to a new home.  He did have some left arm and shoulder pain around Thanksgiving, which he felt was musculoskeletal as it was reproducible with position changes and palpation.  He denies dyspnea, PND, orthopnea, dizziness, syncope, edema, palpitations, or early satiety.   Objective   Home Medications    Current Outpatient Medications  Medication Sig Dispense Refill   augmented betamethasone dipropionate (DIPROLENE-AF) 0.05 % cream APPLY TO AFFECTED AREA ON ELBOWS TWICE A DAY UNTIL CLEAR, THEN USE AS NEEDED     calcipotriene (DOVONOX) 0.005 % cream APPLY ON THE SKIN AS DIRECTED TWICE DAILY TO FACE AND FOLD OF SKIN UNTIL CLEAR THEN AS NEEDED     famotidine  (PEPCID ) 20 MG tablet Take 1 tablet (20 mg total) by mouth at bedtime. 90 tablet 3   fluticasone  (FLONASE ) 50 MCG/ACT nasal spray Place 2 sprays into both nostrils daily. 48 mL 4   ketoconazole (NIZORAL) 2 % shampoo APPLY TO AFFECTED AREA EVERY DAY     levocetirizine (XYZAL ) 5 MG  tablet Take 1 tablet (5 mg total) by mouth every evening. 90 tablet 3   levothyroxine  (SYNTHROID ) 50 MCG tablet Take 1 tablet (50 mcg total) by mouth daily. 90 tablet 3   metoprolol  tartrate (LOPRESSOR ) 50 MG tablet Take 1 tablet (50 mg total) by mouth 2 (two) times daily. 180 tablet 3   Multiple Vitamin (MULTIVITAMIN ADULT) TABS Take 1 tablet by mouth daily.     OTEZLA 30 MG TABS Take 1 tablet by mouth 2 (two) times daily.     Probiotic Product  (PROBIOTIC PO) Take 2 tablets by mouth daily before breakfast.     No current facility-administered medications for this visit.     Physical Exam    VS:  BP (!) 124/90 (BP Location: Left Arm, Patient Position: Sitting, Cuff Size: Large)   Pulse 65   Ht 5' 8.5 (1.74 m)   Wt (!) 309 lb 8 oz (140.4 kg)   SpO2 98%   BMI 46.37 kg/m  , BMI Body mass index is 46.37 kg/m.          GEN: Well nourished, well developed, in no acute distress. HEENT: normal. Neck: Supple, no JVD, carotid bruits, or masses. Cardiac: RRR, no murmurs, rubs, or gallops. No clubbing, cyanosis, edema.  Radials 2+/PT 2+ and equal bilaterally.  Respiratory:  Respirations regular and unlabored, clear to auscultation bilaterally. GI: Soft, nontender, nondistended, BS + x 4. MS: no deformity or atrophy. Skin: warm and dry, no rash. Neuro:  Strength and sensation are intact. Psych: Normal affect.  Accessory Clinical Findings    ECG personally reviewed by me today - EKG Interpretation Date/Time:  Friday January 02 2024 08:08:01 EST Ventricular Rate:  65 PR Interval:  158 QRS Duration:  86 QT Interval:  358 QTC Calculation: 372 R Axis:   34  Text Interpretation: Normal sinus rhythm Normal ECG Confirmed by Vivienne Bruckner [phone]) on 01/02/2024 8:10:47 AM  - no acute changes.  Lab Results  Component Value Date   WBC 8.2 11/24/2023   HGB 14.0 11/24/2023   HCT 41.2 11/24/2023   MCV 83.3 11/24/2023   PLT 239.0 11/24/2023   Lab Results  Component Value Date   CREATININE 0.91 11/24/2023   BUN 13 11/24/2023   NA 137 11/24/2023   K 4.6 11/24/2023   CL 100 11/24/2023   CO2 29 11/24/2023   Lab Results  Component Value Date   ALT 45 11/24/2023   AST 25 11/24/2023   ALKPHOS 73 11/24/2023   BILITOT 0.6 11/24/2023   Lab Results  Component Value Date   CHOL 187 11/24/2023   HDL 35.60 (L) 11/24/2023   LDLCALC 126 (H) 11/24/2023   LDLDIRECT 151.0 11/09/2015   TRIG 127.0 11/24/2023   CHOLHDL 5  11/24/2023     Lab Results  Component Value Date   TSH 2.56 11/24/2023       Assessment & Plan    1.  Precordial chest pain: Previous reported intermittent, focal, poking sensation, associated with palpitations.  Prior event monitoring and ECG were unremarkable.  Occasional poking sensation but not nearly as often as previous.  Also had some left arm and shoulder pain around Thanksgiving, which lasted about 2 days and was entirely reproducible with palpation and movements.  Active without angina or dyspnea.  No further ischemic evaluation warranted at this time.  2.  Palpitations: See #1.  Quiescent.  Prior monitoring without sustained arrhythmias or pauses.  Continue beta-blocker therapy.  3.  Primary hypertension: Diastolic blood pressure elevated (  124/90).  He does not check blood pressure at home.  Encouraged him to do so at least 3 times a week.  Also strongly encouraged regular exercise and weight loss.  Continue current dose of beta-blocker.  4.  Morbid obesity: Had been exercising but then moved into the home that is 35 minutes away from his gym and so he canceled gym membership.  He is buying a treadmill for Christmas as well as some weights to build a home gym.  Strongly encouraged 30 minutes of exercise daily.  Says he has been trying to add more fiber to his diet in the setting of recent elevated cholesterol (126).  We discussed adding fiber through fruits, vegetables, and whole grains, with reduction/replacement of meat products.  5.  Disposition: Follow-up in 1 year or sooner if necessary.  Lonni Meager, NP 01/02/2024, 8:10 AM     [1] No Known Allergies  "

## 2024-01-16 ENCOUNTER — Encounter: Payer: Self-pay | Admitting: Family Medicine

## 2024-01-16 ENCOUNTER — Ambulatory Visit: Admitting: Family Medicine

## 2024-01-16 VITALS — BP 124/86 | HR 61 | Temp 98.0°F | Ht 68.5 in | Wt 307.8 lb

## 2024-01-16 DIAGNOSIS — H6992 Unspecified Eustachian tube disorder, left ear: Secondary | ICD-10-CM | POA: Diagnosis not present

## 2024-01-16 MED ORDER — PREDNISONE 20 MG PO TABS: ORAL_TABLET | ORAL | 0 refills | Status: DC

## 2024-01-16 NOTE — Progress Notes (Signed)
 " Ph: [phone] Fax: [phone]   Patient ID: Micheal [name], male    DOB: 03/11/1990, 34 y.o.   MRN: [id number]  This visit was conducted in person.  BP 124/86 (Cuff Size: Large)   Pulse 61   Temp 98 F (36.7 C) (Oral)   Ht 5' 8.5 (1.74 m)   Wt (!) 307 lb 12.8 oz (139.6 kg)   SpO2 96%   BMI 46.12 kg/m    CC: tinnitus  Subjective:   HPI: Micheal [name] is a 34 y.o. male presenting on 01/16/2024 for Acute Visit (Bilateral ringing in ears since recovering from Acute Bacterial Sinuitis, Onset 1.5 week/Pt states left side feels stopped up//FYI was treated with Augmentin and Tessalon for Acute Bacterial Sinuitis by [facility] online visit)   Recently seen by telehealth for acute sinusitis symptoms had been ongoing for 2 wks, treated with augmentin and cough syrup. He had facial pain, bad sore throat, and PNDrainage. Symptoms did improve after this.   Notes persistent high pitched tinnitus L>R, L hearing loss and stopped up L ear since that time.   No fevers/chills, no further facial or tooth pain.  Hasn't tried anything otherwise.  He has not been using flonase .      Relevant past medical, surgical, family and social history reviewed and updated as indicated. Interim medical history since our last visit reviewed. Allergies and medications reviewed and updated. Outpatient Medications Prior to Visit  Medication Sig Dispense Refill   augmented betamethasone dipropionate (DIPROLENE-AF) 0.05 % cream APPLY TO AFFECTED AREA ON ELBOWS TWICE A DAY UNTIL CLEAR, THEN USE AS NEEDED     calcipotriene (DOVONOX) 0.005 % cream APPLY ON THE SKIN AS DIRECTED TWICE DAILY TO FACE AND FOLD OF SKIN UNTIL CLEAR THEN AS NEEDED     famotidine  (PEPCID ) 20 MG tablet Take 1 tablet (20 mg total) by mouth at bedtime. 90 tablet 3   fluocinonide (LIDEX) 0.05 % external solution Apply 1 Application topically as needed.     fluticasone  (FLONASE ) 50 MCG/ACT nasal spray Place 2 sprays into both nostrils daily.  48 mL 4   ketoconazole (NIZORAL) 2 % shampoo APPLY TO AFFECTED AREA EVERY DAY     levocetirizine (XYZAL ) 5 MG tablet Take 1 tablet (5 mg total) by mouth every evening. 90 tablet 3   levothyroxine  (SYNTHROID ) 50 MCG tablet Take 1 tablet (50 mcg total) by mouth daily. 90 tablet 3   metoprolol  tartrate (LOPRESSOR ) 50 MG tablet Take 1 tablet (50 mg total) by mouth 2 (two) times daily. 180 tablet 3   Multiple Vitamin (MULTIVITAMIN ADULT) TABS Take 1 tablet by mouth daily.     OTEZLA 30 MG TABS Take 1 tablet by mouth 2 (two) times daily.     Probiotic Product (PROBIOTIC PO) Take 2 tablets by mouth daily before breakfast.     No facility-administered medications prior to visit.     Per HPI unless specifically indicated in ROS section below Review of Systems  Objective:  BP 124/86 (Cuff Size: Large)   Pulse 61   Temp 98 F (36.7 C) (Oral)   Ht 5' 8.5 (1.74 m)   Wt (!) 307 lb 12.8 oz (139.6 kg)   SpO2 96%   BMI 46.12 kg/m   Wt Readings from Last 3 Encounters:  01/16/24 (!) 307 lb 12.8 oz (139.6 kg)  01/02/24 (!) 309 lb 8 oz (140.4 kg)  12/02/23 (!) 303 lb 6 oz (137.6 kg)      Physical Exam  Vitals and nursing note reviewed.  Constitutional:      Appearance: Normal appearance. He is not ill-appearing.  HENT:     Head: Normocephalic and atraumatic.     Right Ear: Hearing, tympanic membrane, ear canal and external ear normal. There is no impacted cerumen.     Left Ear: Hearing, tympanic membrane, ear canal and external ear normal. There is no impacted cerumen.     Ears:     Comments: TMs pearly grey bilaterally, mild dullness on left    Nose: Nose normal. No mucosal edema, congestion or rhinorrhea.     Right Turbinates: Pale. Not enlarged or swollen.     Left Turbinates: Pale. Not enlarged or swollen.     Right Sinus: No maxillary sinus tenderness or frontal sinus tenderness.     Left Sinus: No maxillary sinus tenderness or frontal sinus tenderness.     Mouth/Throat:     Mouth:  Mucous membranes are moist.     Pharynx: Oropharynx is clear. No oropharyngeal exudate or posterior oropharyngeal erythema.     Comments: Chronically enlarged R tonsil without erythema  Eyes:     Extraocular Movements: Extraocular movements intact.     Conjunctiva/sclera: Conjunctivae normal.     Pupils: Pupils are equal, round, and reactive to light.  Cardiovascular:     Rate and Rhythm: Normal rate and regular rhythm.     Pulses: Normal pulses.     Heart sounds: Normal heart sounds. No murmur heard. Pulmonary:     Effort: Pulmonary effort is normal. No respiratory distress.     Breath sounds: Normal breath sounds. No wheezing, rhonchi or rales.  Musculoskeletal:     Cervical back: Normal range of motion and neck supple. No rigidity.     Right lower leg: No edema.     Left lower leg: No edema.  Lymphadenopathy:     Cervical: No cervical adenopathy.  Skin:    General: Skin is warm and dry.     Findings: No rash.  Neurological:     Mental Status: He is alert.  Psychiatric:        Mood and Affect: Mood normal.        Behavior: Behavior normal.        Assessment & Plan:   Problem List Items Addressed This Visit     ETD (Eustachian tube dysfunction), left - Primary   Anticipate persistent ETD after recent acute bacterial sinusitis treated with augmentin. Rec flonase , nasal saline irrigation WASP prednisone taper printed for pt with indications when to fill. Update if ongoing symptoms or not improving over next 2-3 weeks         Meds ordered this encounter  Medications   predniSONE (DELTASONE) 20 MG tablet    Sig: Take two tablets daily for 3 days followed by one tablet daily for 4 days    Dispense:  10 tablet    Refill:  0    No orders of the defined types were placed in this encounter.   Patient Instructions  You have ongoing inflammation and eustachian tube dysfunction after sinus infection likely causing these symptoms. Should continue improving over the next  few weeks.  Treat with flonase  nasal steroid 2 squirts into each nostril daily.  May fill prednisone if not improving with this.  If ongoing ringing, muffled hearing past 3-4 weeks, let us  know.   Follow up plan: Return if symptoms worsen or fail to improve.  Anton Blas, MD   "

## 2024-01-16 NOTE — Patient Instructions (Signed)
 You have ongoing inflammation and eustachian tube dysfunction after sinus infection likely causing these symptoms. Should continue improving over the next few weeks.  Treat with flonase  nasal steroid 2 squirts into each nostril daily.  May fill prednisone if not improving with this.  If ongoing ringing, muffled hearing past 3-4 weeks, let us  know.

## 2024-01-16 NOTE — Assessment & Plan Note (Signed)
 Anticipate persistent ETD after recent acute bacterial sinusitis treated with augmentin. Rec flonase , nasal saline irrigation WASP prednisone taper printed for pt with indications when to fill. Update if ongoing symptoms or not improving over next 2-3 weeks

## 2024-01-27 ENCOUNTER — Encounter: Payer: Self-pay | Admitting: Family Medicine

## 2024-11-26 ENCOUNTER — Other Ambulatory Visit

## 2024-12-03 ENCOUNTER — Encounter: Admitting: Family Medicine
